# Patient Record
Sex: Female | Born: 1940
Health system: Southern US, Community
[De-identification: ages and names within clinical notes are randomized; demographics above are authoritative.]

## PROBLEM LIST (undated history)

## (undated) DIAGNOSIS — M4800 Spinal stenosis, site unspecified: Secondary | ICD-10-CM

## (undated) DIAGNOSIS — I10 Essential (primary) hypertension: Secondary | ICD-10-CM

## (undated) DIAGNOSIS — E119 Type 2 diabetes mellitus without complications: Secondary | ICD-10-CM

## (undated) DIAGNOSIS — M1990 Unspecified osteoarthritis, unspecified site: Secondary | ICD-10-CM

## (undated) DIAGNOSIS — R059 Cough, unspecified: Secondary | ICD-10-CM

## (undated) DIAGNOSIS — E039 Hypothyroidism, unspecified: Secondary | ICD-10-CM

## (undated) DIAGNOSIS — R05 Cough: Secondary | ICD-10-CM

## (undated) DIAGNOSIS — J45909 Unspecified asthma, uncomplicated: Secondary | ICD-10-CM

## (undated) DIAGNOSIS — C4490 Unspecified malignant neoplasm of skin, unspecified: Secondary | ICD-10-CM

## (undated) DIAGNOSIS — G459 Transient cerebral ischemic attack, unspecified: Secondary | ICD-10-CM

## (undated) DIAGNOSIS — I739 Peripheral vascular disease, unspecified: Secondary | ICD-10-CM

## (undated) DIAGNOSIS — C73 Malignant neoplasm of thyroid gland: Secondary | ICD-10-CM

## (undated) DIAGNOSIS — K219 Gastro-esophageal reflux disease without esophagitis: Secondary | ICD-10-CM

## (undated) DIAGNOSIS — I499 Cardiac arrhythmia, unspecified: Secondary | ICD-10-CM

## (undated) DIAGNOSIS — M8580 Other specified disorders of bone density and structure, unspecified site: Secondary | ICD-10-CM

## (undated) HISTORY — PX: HAND SURGERY: SHX662

## (undated) HISTORY — PX: WRIST SURGERY: SHX841

## (undated) HISTORY — PX: ORIF FIBULA FRACTURE: SHX2121

## (undated) HISTORY — PX: BREAST EXCISIONAL BIOPSY: SUR124

## (undated) HISTORY — PX: SKIN CANCER EXCISION: SHX779

## (undated) HISTORY — PX: BREAST CYST ASPIRATION: SHX578

## (undated) HISTORY — PX: JOINT REPLACEMENT: SHX530

## (undated) HISTORY — PX: CHOLECYSTECTOMY: SHX55

## (undated) HISTORY — PX: MIDDLE EAR SURGERY: SHX713

## (undated) HISTORY — PX: TONSILLECTOMY: SUR1361

---

## 2005-05-08 ENCOUNTER — Ambulatory Visit: Payer: Self-pay | Admitting: Obstetrics and Gynecology

## 2006-02-04 ENCOUNTER — Other Ambulatory Visit: Payer: Self-pay

## 2006-02-04 ENCOUNTER — Ambulatory Visit: Payer: Self-pay | Admitting: Obstetrics and Gynecology

## 2006-02-06 ENCOUNTER — Ambulatory Visit: Payer: Self-pay | Admitting: Gastroenterology

## 2006-02-09 ENCOUNTER — Ambulatory Visit: Payer: Self-pay | Admitting: Obstetrics and Gynecology

## 2006-02-17 ENCOUNTER — Ambulatory Visit: Payer: Self-pay | Admitting: Gastroenterology

## 2006-05-11 ENCOUNTER — Ambulatory Visit: Payer: Self-pay | Admitting: Obstetrics and Gynecology

## 2007-05-18 ENCOUNTER — Ambulatory Visit: Payer: Self-pay | Admitting: Obstetrics and Gynecology

## 2008-01-16 ENCOUNTER — Emergency Department: Payer: Self-pay | Admitting: Emergency Medicine

## 2008-06-06 ENCOUNTER — Ambulatory Visit: Payer: Self-pay | Admitting: Obstetrics and Gynecology

## 2008-09-15 ENCOUNTER — Ambulatory Visit: Payer: Self-pay | Admitting: Internal Medicine

## 2009-06-11 ENCOUNTER — Ambulatory Visit: Payer: Self-pay | Admitting: Obstetrics and Gynecology

## 2010-06-13 ENCOUNTER — Ambulatory Visit: Payer: Self-pay | Admitting: Obstetrics and Gynecology

## 2010-06-18 ENCOUNTER — Ambulatory Visit: Payer: Self-pay | Admitting: Obstetrics and Gynecology

## 2010-09-29 DIAGNOSIS — G459 Transient cerebral ischemic attack, unspecified: Secondary | ICD-10-CM

## 2010-09-29 HISTORY — DX: Transient cerebral ischemic attack, unspecified: G45.9

## 2010-12-24 ENCOUNTER — Ambulatory Visit: Payer: Self-pay | Admitting: Surgery

## 2011-01-20 ENCOUNTER — Observation Stay: Payer: Self-pay | Admitting: Internal Medicine

## 2011-01-22 ENCOUNTER — Encounter: Payer: Self-pay | Admitting: Internal Medicine

## 2011-01-28 ENCOUNTER — Encounter: Payer: Self-pay | Admitting: Internal Medicine

## 2011-06-16 ENCOUNTER — Ambulatory Visit: Payer: Self-pay | Admitting: Obstetrics and Gynecology

## 2012-05-19 ENCOUNTER — Ambulatory Visit: Payer: Self-pay | Admitting: Internal Medicine

## 2012-06-16 ENCOUNTER — Ambulatory Visit: Payer: Self-pay | Admitting: Obstetrics and Gynecology

## 2013-06-17 ENCOUNTER — Ambulatory Visit: Payer: Self-pay | Admitting: Obstetrics and Gynecology

## 2013-06-21 ENCOUNTER — Ambulatory Visit: Payer: Self-pay | Admitting: Internal Medicine

## 2013-06-23 ENCOUNTER — Ambulatory Visit: Payer: Self-pay | Admitting: Hematology and Oncology

## 2013-06-24 ENCOUNTER — Ambulatory Visit: Payer: Self-pay | Admitting: Hematology and Oncology

## 2013-06-29 ENCOUNTER — Ambulatory Visit: Payer: Self-pay | Admitting: Hematology and Oncology

## 2013-07-30 ENCOUNTER — Ambulatory Visit: Payer: Self-pay | Admitting: Hematology and Oncology

## 2013-10-17 ENCOUNTER — Ambulatory Visit: Payer: Self-pay | Admitting: Hematology and Oncology

## 2013-10-18 ENCOUNTER — Ambulatory Visit: Payer: Self-pay | Admitting: Hematology and Oncology

## 2013-10-30 ENCOUNTER — Ambulatory Visit: Payer: Self-pay | Admitting: Hematology and Oncology

## 2013-12-14 ENCOUNTER — Ambulatory Visit: Payer: Self-pay | Admitting: Orthopedic Surgery

## 2013-12-14 LAB — CBC WITH DIFFERENTIAL/PLATELET
BASOS ABS: 0.1 10*3/uL (ref 0.0–0.1)
Basophil %: 0.7 %
EOS ABS: 0.3 10*3/uL (ref 0.0–0.7)
Eosinophil %: 2.7 %
HCT: 35.1 % (ref 35.0–47.0)
HGB: 11.9 g/dL — ABNORMAL LOW (ref 12.0–16.0)
LYMPHS PCT: 18.1 %
Lymphocyte #: 1.7 10*3/uL (ref 1.0–3.6)
MCH: 31.3 pg (ref 26.0–34.0)
MCHC: 34 g/dL (ref 32.0–36.0)
MCV: 92 fL (ref 80–100)
MONO ABS: 0.8 x10 3/mm (ref 0.2–0.9)
Monocyte %: 8.2 %
NEUTROS ABS: 6.8 10*3/uL — AB (ref 1.4–6.5)
Neutrophil %: 70.3 %
Platelet: 354 10*3/uL (ref 150–440)
RBC: 3.8 10*6/uL (ref 3.80–5.20)
RDW: 13.1 % (ref 11.5–14.5)
WBC: 9.6 10*3/uL (ref 3.6–11.0)

## 2013-12-14 LAB — BASIC METABOLIC PANEL
ANION GAP: 5 — AB (ref 7–16)
BUN: 21 mg/dL — AB (ref 7–18)
CHLORIDE: 100 mmol/L (ref 98–107)
CO2: 33 mmol/L — AB (ref 21–32)
CREATININE: 1.03 mg/dL (ref 0.60–1.30)
Calcium, Total: 8.9 mg/dL (ref 8.5–10.1)
EGFR (African American): >60
EGFR (Non-African Amer.): 54 — ABNORMAL LOW
GLUCOSE: 162 mg/dL — AB (ref 65–99)
OSMOLALITY: 282 (ref 275–301)
Potassium: 3.4 mmol/L — ABNORMAL LOW (ref 3.5–5.1)
SODIUM: 138 mmol/L (ref 136–145)

## 2013-12-16 ENCOUNTER — Ambulatory Visit: Payer: Self-pay | Admitting: Orthopedic Surgery

## 2014-02-03 DIAGNOSIS — M189 Osteoarthritis of first carpometacarpal joint, unspecified: Secondary | ICD-10-CM | POA: Insufficient documentation

## 2014-02-03 DIAGNOSIS — Z8781 Personal history of (healed) traumatic fracture: Secondary | ICD-10-CM | POA: Insufficient documentation

## 2014-02-17 ENCOUNTER — Ambulatory Visit: Payer: Self-pay | Admitting: Hematology and Oncology

## 2014-02-24 ENCOUNTER — Ambulatory Visit: Payer: Self-pay | Admitting: Hematology and Oncology

## 2014-02-24 LAB — CBC CANCER CENTER
BASOS ABS: 0.1 x10 3/mm (ref 0.0–0.1)
Basophil %: 0.5 %
EOS ABS: 0.2 x10 3/mm (ref 0.0–0.7)
EOS PCT: 1.6 %
HCT: 38.2 % (ref 35.0–47.0)
HGB: 13 g/dL (ref 12.0–16.0)
LYMPHS PCT: 21.2 %
Lymphocyte #: 2.3 x10 3/mm (ref 1.0–3.6)
MCH: 31.2 pg (ref 26.0–34.0)
MCHC: 33.9 g/dL (ref 32.0–36.0)
MCV: 92 fL (ref 80–100)
MONO ABS: 0.9 x10 3/mm (ref 0.2–0.9)
Monocyte %: 8.2 %
NEUTROS PCT: 68.5 %
Neutrophil #: 7.3 x10 3/mm — ABNORMAL HIGH (ref 1.4–6.5)
PLATELETS: 381 x10 3/mm (ref 150–440)
RBC: 4.16 10*6/uL (ref 3.80–5.20)
RDW: 13.1 % (ref 11.5–14.5)
WBC: 10.7 x10 3/mm (ref 3.6–11.0)

## 2014-02-24 LAB — BASIC METABOLIC PANEL
ANION GAP: 9 (ref 7–16)
BUN: 12 mg/dL (ref 7–18)
CALCIUM: 9.5 mg/dL (ref 8.5–10.1)
Chloride: 99 mmol/L (ref 98–107)
Co2: 30 mmol/L (ref 21–32)
Creatinine: 0.75 mg/dL (ref 0.60–1.30)
EGFR (Non-African Amer.): >60
GLUCOSE: 97 mg/dL (ref 65–99)
OSMOLALITY: 275 (ref 275–301)
POTASSIUM: 4 mmol/L (ref 3.5–5.1)
SODIUM: 138 mmol/L (ref 136–145)

## 2014-02-27 ENCOUNTER — Ambulatory Visit: Payer: Self-pay | Admitting: Hematology and Oncology

## 2014-06-19 ENCOUNTER — Ambulatory Visit: Payer: Self-pay | Admitting: Internal Medicine

## 2014-08-18 ENCOUNTER — Ambulatory Visit: Payer: Self-pay | Admitting: Hematology and Oncology

## 2014-08-22 ENCOUNTER — Ambulatory Visit: Payer: Self-pay | Admitting: Hematology and Oncology

## 2014-08-29 ENCOUNTER — Ambulatory Visit: Payer: Self-pay | Admitting: Hematology and Oncology

## 2014-09-29 DIAGNOSIS — C73 Malignant neoplasm of thyroid gland: Secondary | ICD-10-CM

## 2014-09-29 HISTORY — DX: Malignant neoplasm of thyroid gland: C73

## 2015-01-12 ENCOUNTER — Other Ambulatory Visit: Payer: Self-pay | Admitting: Hematology and Oncology

## 2015-01-12 DIAGNOSIS — R911 Solitary pulmonary nodule: Secondary | ICD-10-CM

## 2015-01-17 ENCOUNTER — Ambulatory Visit
Admit: 2015-01-17 | Disposition: A | Discharge status: Home / Self Care | Payer: Self-pay | Attending: Ophthalmology | Admitting: Ophthalmology

## 2015-01-17 HISTORY — PX: CATARACT EXTRACTION: SUR2

## 2015-01-20 NOTE — Op Note (Signed)
PATIENT NAME:  Alice Weaver, Alice Weaver MR#:  [id number] DATE OF BIRTH:  June 23, 1941  DATE OF PROCEDURE:  12/16/2013  PREOPERATIVE DIAGNOSIS: Right distal radius fracture, displaced.   POSTOPERATIVE DIAGNOSIS:  Right distal radius fracture, displaced.  PROCEDURE PERFORMED: ORIF, right distal radius.   ANESTHESIA: General.   SURGEON: Hessie Knows, M.D.   DESCRIPTION OF PROCEDURE: The patient was brought to the operating room and after adequate anesthesia was obtained, the right arm was prepped and draped in the usual sterile fashion with a tourniquet applied to the upper arm. After patient identification and timeout procedures were completed, the tourniquet was raised to 250 mmHg and fingertrap traction was applied with 10 pounds of traction. Length was restored along with radial inclination and some volar tilt. After initial assessment under the mini C-arm, a volar approach was made centered over the FCR tendon. Tendon sheath incised and the tendon retracted radially, protecting the radial artery. The deep fascia was incised and the pronator was elevated off the distal radius. The fracture was 2 main fragments and did not appear to have intra-articular extension. After exposing the volar surface of the distal radius, a short narrow DVR plate was applied and placed in the appropriate position with a pin holding it in position initially. Slotted screw hole was filled first, and then minor adjustments made to plate position. The 2 remaining cortical screw holes were then filled. With the wrist held in a flexed position, distal smooth pegs were placed, and the proximal row multiaccess pins were placed to prevent penetration into the joint. After all peg holes were filled, the traction was released and on placing the wrist through range of motion, there was appropriate and stable fixation apparent with no evidence of motion at the site. The tourniquet was let down and the wound irrigated. There was no significant  bleeding. The wound was closed with 3-0 Vicryl subcutaneously and 4-0 nylon for the skin. Xeroform, 4 x 4's, Webril and volar splint were applied followed by an Ace wrap. Tourniquet time was 28 minutes at 250 mmHg.  There were no complications and no specimen.  IMPLANTS:  [employer] from [employer], short narrow right DVR plate with multiple pegs and screws.    ____________________________ Laurene Footman, MD mjm:dmm D: 12/16/2013 12:24:45 ET T: 12/16/2013 14:00:44 ET JOB#: [id number]  cc: Laurene Footman, MD, <Dictator> Laurene Footman MD ELECTRONICALLY SIGNED 12/17/2013 11:19

## 2015-01-25 DIAGNOSIS — E041 Nontoxic single thyroid nodule: Secondary | ICD-10-CM | POA: Insufficient documentation

## 2015-02-23 ENCOUNTER — Encounter: Payer: Self-pay | Admitting: *Deleted

## 2015-02-27 NOTE — Discharge Instructions (Signed)

## 2015-02-28 ENCOUNTER — Ambulatory Visit: Payer: PPO | Admitting: Anesthesiology

## 2015-02-28 ENCOUNTER — Encounter: Payer: Self-pay | Admitting: Anesthesiology

## 2015-02-28 ENCOUNTER — Ambulatory Visit
Admission: RE | Admit: 2015-02-28 | Discharge: 2015-02-28 | Disposition: A | Discharge status: Home / Self Care | Payer: PPO | Source: Ambulatory Visit | Attending: Ophthalmology | Admitting: Ophthalmology

## 2015-02-28 ENCOUNTER — Encounter
Admission: RE | Disposition: A | Discharge status: Home / Self Care | Payer: Self-pay | Source: Ambulatory Visit | Attending: Ophthalmology

## 2015-02-28 DIAGNOSIS — M1990 Unspecified osteoarthritis, unspecified site: Secondary | ICD-10-CM | POA: Diagnosis not present

## 2015-02-28 DIAGNOSIS — I10 Essential (primary) hypertension: Secondary | ICD-10-CM | POA: Insufficient documentation

## 2015-02-28 DIAGNOSIS — Z87891 Personal history of nicotine dependence: Secondary | ICD-10-CM | POA: Insufficient documentation

## 2015-02-28 DIAGNOSIS — Z8673 Personal history of transient ischemic attack (TIA), and cerebral infarction without residual deficits: Secondary | ICD-10-CM | POA: Insufficient documentation

## 2015-02-28 DIAGNOSIS — Z96659 Presence of unspecified artificial knee joint: Secondary | ICD-10-CM | POA: Diagnosis not present

## 2015-02-28 DIAGNOSIS — K219 Gastro-esophageal reflux disease without esophagitis: Secondary | ICD-10-CM | POA: Diagnosis not present

## 2015-02-28 DIAGNOSIS — E119 Type 2 diabetes mellitus without complications: Secondary | ICD-10-CM | POA: Diagnosis not present

## 2015-02-28 DIAGNOSIS — E780 Pure hypercholesterolemia: Secondary | ICD-10-CM | POA: Diagnosis not present

## 2015-02-28 DIAGNOSIS — H2511 Age-related nuclear cataract, right eye: Secondary | ICD-10-CM | POA: Insufficient documentation

## 2015-02-28 DIAGNOSIS — J45909 Unspecified asthma, uncomplicated: Secondary | ICD-10-CM | POA: Diagnosis not present

## 2015-02-28 HISTORY — DX: Unspecified osteoarthritis, unspecified site: M19.90

## 2015-02-28 HISTORY — DX: Transient cerebral ischemic attack, unspecified: G45.9

## 2015-02-28 HISTORY — DX: Cardiac arrhythmia, unspecified: I49.9

## 2015-02-28 HISTORY — DX: Other specified disorders of bone density and structure, unspecified site: M85.80

## 2015-02-28 HISTORY — DX: Hypothyroidism, unspecified: E03.9

## 2015-02-28 HISTORY — DX: Type 2 diabetes mellitus without complications: E11.9

## 2015-02-28 HISTORY — DX: Cough, unspecified: R05.9

## 2015-02-28 HISTORY — DX: Essential (primary) hypertension: I10

## 2015-02-28 HISTORY — DX: Peripheral vascular disease, unspecified: I73.9

## 2015-02-28 HISTORY — DX: Unspecified malignant neoplasm of skin, unspecified: C44.90

## 2015-02-28 HISTORY — DX: Spinal stenosis, site unspecified: M48.00

## 2015-02-28 HISTORY — DX: Cough: R05

## 2015-02-28 HISTORY — DX: Gastro-esophageal reflux disease without esophagitis: K21.9

## 2015-02-28 HISTORY — DX: Unspecified asthma, uncomplicated: J45.909

## 2015-02-28 HISTORY — PX: CATARACT EXTRACTION W/PHACO: SHX586

## 2015-02-28 LAB — GLUCOSE, CAPILLARY: GLUCOSE-CAPILLARY: 144 mg/dL — AB (ref 65–99)

## 2015-02-28 SURGERY — PHACOEMULSIFICATION, CATARACT, WITH IOL INSERTION
Anesthesia: Monitor Anesthesia Care | Laterality: Right

## 2015-02-28 MED ORDER — TETRACAINE HCL 0.5 % OP SOLN
1.0000 [drp] | OPHTHALMIC | Status: DC | PRN
  Administered 2015-02-28: 1 [drp] via OPHTHALMIC

## 2015-02-28 MED ORDER — EPINEPHRINE HCL 1 MG/ML IJ SOLN
INTRAMUSCULAR | Status: DC | PRN
  Administered 2015-02-28: 1 mg

## 2015-02-28 MED ORDER — MIDAZOLAM HCL 2 MG/2ML IJ SOLN
INTRAMUSCULAR | Status: DC | PRN
  Administered 2015-02-28 (×2): 1 mg via INTRAVENOUS

## 2015-02-28 MED ORDER — POVIDONE-IODINE 5 % OP SOLN
1.0000 "application " | OPHTHALMIC | Status: DC | PRN
  Administered 2015-02-28: 1 via OPHTHALMIC

## 2015-02-28 MED ORDER — NA HYALUR & NA CHOND-NA HYALUR 0.4-0.35 ML IO KIT
PACK | INTRAOCULAR | Status: DC | PRN
  Administered 2015-02-28: 1 mL via INTRAOCULAR

## 2015-02-28 MED ORDER — ARMC OPHTHALMIC DILATING GEL
1.0000 "application " | OPHTHALMIC | Status: DC | PRN
  Administered 2015-02-28 (×2): 1 via OPHTHALMIC

## 2015-02-28 MED ORDER — CEFUROXIME OPHTHALMIC INJECTION 1 MG/0.1 ML
INJECTION | OPHTHALMIC | Status: DC | PRN
  Administered 2015-02-28: 0.1 mL via INTRACAMERAL

## 2015-02-28 MED ORDER — ERYTHROMYCIN 5 MG/GM OP OINT
TOPICAL_OINTMENT | OPHTHALMIC | Status: DC | PRN
  Administered 2015-02-28: 1 via OPHTHALMIC

## 2015-02-28 MED ORDER — BRIMONIDINE TARTRATE 0.2 % OP SOLN
OPHTHALMIC | Status: DC | PRN
  Administered 2015-02-28: 1 [drp] via OPHTHALMIC

## 2015-02-28 MED ORDER — TIMOLOL MALEATE 0.5 % OP SOLN
OPHTHALMIC | Status: DC | PRN
  Administered 2015-02-28: 1 [drp] via OPHTHALMIC

## 2015-02-28 MED ORDER — ALFENTANIL 500 MCG/ML IJ INJ
INJECTION | INTRAMUSCULAR | Status: DC | PRN
  Administered 2015-02-28: 300 ug via INTRAVENOUS
  Administered 2015-02-28: 400 ug via INTRAVENOUS
  Administered 2015-02-28: 300 ug via INTRAVENOUS

## 2015-02-28 MED ORDER — LIDOCAINE HCL (PF) 4 % IJ SOLN
INTRAMUSCULAR | Status: DC | PRN
  Administered 2015-02-28: 2.5 mL

## 2015-02-28 SURGICAL SUPPLY — 27 items
CANNULA ANT/CHMB 27GA (MISCELLANEOUS) ×3 IMPLANT
GLOVE SURG LX 7.5 STRW (GLOVE) ×2
GLOVE SURG LX STRL 7.5 STRW (GLOVE) ×1 IMPLANT
GLOVE SURG TRIUMPH 8.0 PF LTX (GLOVE) ×3 IMPLANT
GOWN STRL REUS W/ TWL LRG LVL3 (GOWN DISPOSABLE) ×2 IMPLANT
GOWN STRL REUS W/TWL LRG LVL3 (GOWN DISPOSABLE) ×4
LENS IOL TECNIS TRC I 225 20.5 (Intraocular Lens) ×1 IMPLANT
LENS IOL TORIC 20.5 (Intraocular Lens) ×2 IMPLANT
LENS IOL TORIC 225 20.5 (Intraocular Lens) ×1 IMPLANT
MARKER SKIN SURG W/RULER VIO (MISCELLANEOUS) ×3 IMPLANT
NDL RETROBULBAR .5 NSTRL (NEEDLE) IMPLANT
NEEDLE FILTER BLUNT 18X 1/2SAF (NEEDLE) ×2
NEEDLE FILTER BLUNT 18X1 1/2 (NEEDLE) ×1 IMPLANT
PACK CATARACT BRASINGTON (MISCELLANEOUS) ×3 IMPLANT
PACK EYE AFTER SURG (MISCELLANEOUS) ×3 IMPLANT
PACK OPTHALMIC (MISCELLANEOUS) ×3 IMPLANT
RING MALYGIN 7.0 (MISCELLANEOUS) IMPLANT
SUT ETHILON 10-0 CS-B-6CS-B-6 (SUTURE)
SUT VICRYL  9 0 (SUTURE)
SUT VICRYL 9 0 (SUTURE) IMPLANT
SUTURE EHLN 10-0 CS-B-6CS-B-6 (SUTURE) IMPLANT
SYR 3ML LL SCALE MARK (SYRINGE) ×3 IMPLANT
SYR 5ML LL (SYRINGE) IMPLANT
SYR TB 1ML LUER SLIP (SYRINGE) ×3 IMPLANT
WATER STERILE IRR 250ML POUR (IV SOLUTION) ×3 IMPLANT
WATER STERILE IRR 500ML POUR (IV SOLUTION) IMPLANT
WIPE NON LINTING 3.25X3.25 (MISCELLANEOUS) ×3 IMPLANT

## 2015-02-28 NOTE — Anesthesia Postprocedure Evaluation (Signed)
  Anesthesia Post-op Note  Patient: [name]  Procedure(s) Performed: Procedure(s) with comments: CATARACT EXTRACTION PHACO AND INTRAOCULAR LENS PLACEMENT ([facility]) (Right) - IVA BLOCK TORIC LENS DIABETIC  Anesthesia type:MAC  Patient location: PACU  Post pain: Pain level controlled  Post assessment: Post-op Vital signs reviewed, Patient's Cardiovascular Status Stable, Respiratory Function Stable, Patent Airway and No signs of Nausea or vomiting  Post vital signs: Reviewed and stable  Last Vitals:  Filed Vitals:   02/28/15 0845  BP: 153/77  Pulse: 83  Temp: 36.6 C  Resp: 18    Level of consciousness: awake, alert  and patient cooperative  Complications: No apparent anesthesia complications

## 2015-02-28 NOTE — Transfer of Care (Signed)
Immediate Anesthesia Transfer of Care Note  Patient: Alice Weaver  Procedure(s) Performed: Procedure(s) with comments: CATARACT EXTRACTION PHACO AND INTRAOCULAR LENS PLACEMENT ([place]) (Right) - IVA BLOCK TORIC LENS DIABETIC  Patient Location: PACU  Anesthesia Type: MAC  Level of Consciousness: awake, alert  and patient cooperative  Airway and Oxygen Therapy: Patient Spontanous Breathing and Patient connected to supplemental oxygen  Post-op Assessment: Post-op Vital signs reviewed, Patient's Cardiovascular Status Stable, Respiratory Function Stable, Patent Airway and No signs of Nausea or vomiting  Post-op Vital Signs: Reviewed and stable  Complications: No apparent anesthesia complications

## 2015-02-28 NOTE — H&P (Signed)
  The History and Physical notes were scanned in.  The patient remains stable and unchanged from the H&P.   Previous H&P reviewed, patient examined, and there are no changes.  Alice Weaver 02/28/2015 7:56 AM

## 2015-02-28 NOTE — Anesthesia Preprocedure Evaluation (Signed)
Anesthesia Evaluation  Patient identified by MRN, date of birth, ID band  Reviewed: Allergy & Precautions, H&P , NPO status , Patient's Chart, lab work & pertinent test results  Airway Mallampati: II  TM Distance: >3 FB Neck ROM: full    Dental no notable dental hx.    Pulmonary asthma , former smoker,    Pulmonary exam normal       Cardiovascular hypertension, + dysrhythmias Rhythm:regular Rate:Normal     Neuro/Psych    GI/Hepatic   Endo/Other  diabetesHypothyroidism   Renal/GU      Musculoskeletal   Abdominal   Peds  Hematology   Anesthesia Other Findings   Reproductive/Obstetrics                             Anesthesia Physical Anesthesia Plan  ASA: II  Anesthesia Plan: MAC   Post-op Pain Management:    Induction:   Airway Management Planned:   Additional Equipment:   Intra-op Plan:   Post-operative Plan:   Informed Consent: I have reviewed the patients History and Physical, chart, labs and discussed the procedure including the risks, benefits and alternatives for the proposed anesthesia with the patient or authorized representative who has indicated his/her understanding and acceptance.     Plan Discussed with: CRNA  Anesthesia Plan Comments:         Anesthesia Quick Evaluation

## 2015-02-28 NOTE — Op Note (Signed)
LOCATION:  [place]   PREOPERATIVE DIAGNOSIS:  Nuclear sclerotic cataract of the right eye.  H25.11   POSTOPERATIVE DIAGNOSIS:  Nuclear sclerotic cataract of the right eye.   PROCEDURE:  Phacoemulsification with Toric posterior chamber intraocular lens placement of the right eye.   LENS:   Implant Name Type Inv. Item Serial No. Manufacturer Lot No. LRB No. Used  intraocular lens Intraocular Lens   [id number]     Right 1     [id number] 20.5 diopter Toric intraocular lens with 2.25 diopters of cylindrical power with axis orientation at 23 degrees.   ULTRASOUND TIME: 18 % of 1 minutes, 5 seconds.  CDE 11.8   SURGEON:  Wyonia Hough, MD   ANESTHESIA:  Retrobulbar block of Xylocaine and Bupivacaine   COMPLICATIONS:  None.   DESCRIPTION OF PROCEDURE:   The patient was identified in the holding room, transported to the operating room, and placed in the supine position.  The right eye was identified as the operative eye and a retrobulbar block was administered under intravenous sedation.  It was then prepped and draped in the usual sterile ophthalmic fashion.  A clear-corneal paracentesis incision was made at the 12:00 position.  The anterior chamber was filled with Viscoat.  A 2.4 millimeter near clear corneal incision was then made at the 9:00 position.  A cystotome and capsulorrhexis forceps were then used to make a curvilinear capsulorrhexis.  Hydrodissection and hydrodelineation were then performed using balanced salt solution.   Phacoemulsification was then used in stop and chop fashion to remove the lens, nucleus and epinucleus.  The remaining cortex was aspirated using the irrigation and aspiration handpiece.  Provisc viscoelastic was then placed into the capsular bag to distend it for lens placement.  The Verion digital marker was used to align the implant at the intended axis.   A Toric lens was then injected into the capsular bag.  It was rotated clockwise until the  axis marks on the lens were approximately 15 degrees in the counterclockwise direction to the intended alignment.  The viscoelastic was aspirated from the eye using the irrigation aspiration handpiece.  Then, a Koch spatula through the sideport incision was used to rotate the lens in a clockwise direction until the axis markings of the intraocular lens were lined up with the Verion alignment.  Balanced salt solution was then used to hydrate the wounds. Cefuroxime 0.1 ml of a 10mg /ml solution was injected into the anterior chamber for a dose of 1 mg of intracameral antibiotic at the completion of the case.     Wounds were hydrated with balanced salt solution.  The anterior chamber was inflated to a physiologic pressure with balanced salt solution.  No wound leaks were noted.Cefuroxime 0.1 ml of a 10mg /ml solution was injected into the anterior chamber for a dose of 1 mg of intracameral antibiotic at the completion of the case.  Timolol and Brimonidine drops were placed followed by erythromycin ointment.  The eye was patched.  The patient was taken to the recovery room in stable condition without complications of anesthesia or surgery.     Devlin Mcveigh 02/28/2015, 8:42 AM

## 2015-02-28 NOTE — Anesthesia Procedure Notes (Signed)
Procedure Name: MAC Date/Time: 02/28/2015 8:12 AM Performed by: Cameron Ali Pre-anesthesia Checklist: Patient identified, Emergency Drugs available, Suction available, Timeout performed and Patient being monitored Patient Re-evaluated:Patient Re-evaluated prior to inductionOxygen Delivery Method: Nasal cannula Placement Confirmation: positive ETCO2

## 2015-04-03 ENCOUNTER — Encounter: Payer: Self-pay | Admitting: Ophthalmology

## 2015-04-18 ENCOUNTER — Ambulatory Visit: Payer: PPO

## 2015-04-23 ENCOUNTER — Ambulatory Visit: Payer: Self-pay | Admitting: Hematology and Oncology

## 2015-05-21 DIAGNOSIS — C73 Malignant neoplasm of thyroid gland: Secondary | ICD-10-CM | POA: Insufficient documentation

## 2015-05-22 ENCOUNTER — Other Ambulatory Visit: Payer: Self-pay | Admitting: Internal Medicine

## 2015-05-22 DIAGNOSIS — Z1231 Encounter for screening mammogram for malignant neoplasm of breast: Secondary | ICD-10-CM

## 2015-06-21 ENCOUNTER — Ambulatory Visit
Admission: RE | Admit: 2015-06-21 | Discharge: 2015-06-21 | Disposition: A | Discharge status: Home / Self Care | Payer: PPO | Source: Ambulatory Visit | Attending: Internal Medicine | Admitting: Internal Medicine

## 2015-06-21 DIAGNOSIS — Z1231 Encounter for screening mammogram for malignant neoplasm of breast: Secondary | ICD-10-CM | POA: Insufficient documentation

## 2015-06-21 HISTORY — DX: Malignant neoplasm of thyroid gland: C73

## 2015-10-12 DIAGNOSIS — E119 Type 2 diabetes mellitus without complications: Secondary | ICD-10-CM | POA: Diagnosis not present

## 2015-10-15 DIAGNOSIS — C73 Malignant neoplasm of thyroid gland: Secondary | ICD-10-CM | POA: Diagnosis not present

## 2015-10-15 DIAGNOSIS — E119 Type 2 diabetes mellitus without complications: Secondary | ICD-10-CM | POA: Diagnosis not present

## 2015-10-15 DIAGNOSIS — I10 Essential (primary) hypertension: Secondary | ICD-10-CM | POA: Diagnosis not present

## 2015-10-15 DIAGNOSIS — Z0000 Encounter for general adult medical examination without abnormal findings: Secondary | ICD-10-CM | POA: Diagnosis not present

## 2015-10-15 DIAGNOSIS — M1990 Unspecified osteoarthritis, unspecified site: Secondary | ICD-10-CM | POA: Diagnosis not present

## 2015-10-29 DIAGNOSIS — C73 Malignant neoplasm of thyroid gland: Secondary | ICD-10-CM | POA: Diagnosis not present

## 2015-10-29 DIAGNOSIS — E890 Postprocedural hypothyroidism: Secondary | ICD-10-CM | POA: Diagnosis not present

## 2015-12-10 DIAGNOSIS — D485 Neoplasm of uncertain behavior of skin: Secondary | ICD-10-CM | POA: Diagnosis not present

## 2015-12-10 DIAGNOSIS — Z85820 Personal history of malignant melanoma of skin: Secondary | ICD-10-CM | POA: Diagnosis not present

## 2015-12-10 DIAGNOSIS — L820 Inflamed seborrheic keratosis: Secondary | ICD-10-CM | POA: Diagnosis not present

## 2015-12-10 DIAGNOSIS — D692 Other nonthrombocytopenic purpura: Secondary | ICD-10-CM | POA: Diagnosis not present

## 2015-12-10 DIAGNOSIS — L821 Other seborrheic keratosis: Secondary | ICD-10-CM | POA: Diagnosis not present

## 2015-12-10 DIAGNOSIS — L720 Epidermal cyst: Secondary | ICD-10-CM | POA: Diagnosis not present

## 2015-12-10 DIAGNOSIS — Z85828 Personal history of other malignant neoplasm of skin: Secondary | ICD-10-CM | POA: Diagnosis not present

## 2015-12-10 DIAGNOSIS — D229 Melanocytic nevi, unspecified: Secondary | ICD-10-CM | POA: Diagnosis not present

## 2015-12-10 DIAGNOSIS — L578 Other skin changes due to chronic exposure to nonionizing radiation: Secondary | ICD-10-CM | POA: Diagnosis not present

## 2015-12-10 DIAGNOSIS — D1800 Hemangioma unspecified site: Secondary | ICD-10-CM | POA: Diagnosis not present

## 2015-12-10 DIAGNOSIS — I8393 Asymptomatic varicose veins of bilateral lower extremities: Secondary | ICD-10-CM | POA: Diagnosis not present

## 2015-12-10 DIAGNOSIS — Z1283 Encounter for screening for malignant neoplasm of skin: Secondary | ICD-10-CM | POA: Diagnosis not present

## 2015-12-18 DIAGNOSIS — E890 Postprocedural hypothyroidism: Secondary | ICD-10-CM | POA: Diagnosis not present

## 2015-12-25 DIAGNOSIS — E890 Postprocedural hypothyroidism: Secondary | ICD-10-CM | POA: Diagnosis not present

## 2015-12-25 DIAGNOSIS — C73 Malignant neoplasm of thyroid gland: Secondary | ICD-10-CM | POA: Diagnosis not present

## 2016-02-04 DIAGNOSIS — Z0000 Encounter for general adult medical examination without abnormal findings: Secondary | ICD-10-CM | POA: Diagnosis not present

## 2016-02-04 DIAGNOSIS — I10 Essential (primary) hypertension: Secondary | ICD-10-CM | POA: Diagnosis not present

## 2016-02-04 DIAGNOSIS — E119 Type 2 diabetes mellitus without complications: Secondary | ICD-10-CM | POA: Diagnosis not present

## 2016-02-04 DIAGNOSIS — M1990 Unspecified osteoarthritis, unspecified site: Secondary | ICD-10-CM | POA: Diagnosis not present

## 2016-02-04 DIAGNOSIS — C73 Malignant neoplasm of thyroid gland: Secondary | ICD-10-CM | POA: Diagnosis not present

## 2016-02-12 DIAGNOSIS — M25561 Pain in right knee: Secondary | ICD-10-CM | POA: Diagnosis not present

## 2016-02-12 DIAGNOSIS — I10 Essential (primary) hypertension: Secondary | ICD-10-CM | POA: Diagnosis not present

## 2016-02-12 DIAGNOSIS — E119 Type 2 diabetes mellitus without complications: Secondary | ICD-10-CM | POA: Diagnosis not present

## 2016-02-12 DIAGNOSIS — R2681 Unsteadiness on feet: Secondary | ICD-10-CM | POA: Diagnosis not present

## 2016-02-12 DIAGNOSIS — C73 Malignant neoplasm of thyroid gland: Secondary | ICD-10-CM | POA: Diagnosis not present

## 2016-02-12 DIAGNOSIS — M179 Osteoarthritis of knee, unspecified: Secondary | ICD-10-CM | POA: Diagnosis not present

## 2016-02-12 DIAGNOSIS — Z85828 Personal history of other malignant neoplasm of skin: Secondary | ICD-10-CM | POA: Diagnosis not present

## 2016-02-12 DIAGNOSIS — Z23 Encounter for immunization: Secondary | ICD-10-CM | POA: Diagnosis not present

## 2016-02-12 DIAGNOSIS — Z780 Asymptomatic menopausal state: Secondary | ICD-10-CM | POA: Diagnosis not present

## 2016-02-26 DIAGNOSIS — Z780 Asymptomatic menopausal state: Secondary | ICD-10-CM | POA: Diagnosis not present

## 2016-02-29 DIAGNOSIS — M25561 Pain in right knee: Secondary | ICD-10-CM | POA: Diagnosis not present

## 2016-02-29 DIAGNOSIS — M1711 Unilateral primary osteoarthritis, right knee: Secondary | ICD-10-CM | POA: Diagnosis not present

## 2016-06-10 DIAGNOSIS — Z780 Asymptomatic menopausal state: Secondary | ICD-10-CM | POA: Diagnosis not present

## 2016-06-10 DIAGNOSIS — R2681 Unsteadiness on feet: Secondary | ICD-10-CM | POA: Diagnosis not present

## 2016-06-10 DIAGNOSIS — E119 Type 2 diabetes mellitus without complications: Secondary | ICD-10-CM | POA: Diagnosis not present

## 2016-06-10 DIAGNOSIS — Z85828 Personal history of other malignant neoplasm of skin: Secondary | ICD-10-CM | POA: Diagnosis not present

## 2016-06-10 DIAGNOSIS — I10 Essential (primary) hypertension: Secondary | ICD-10-CM | POA: Diagnosis not present

## 2016-06-10 DIAGNOSIS — C73 Malignant neoplasm of thyroid gland: Secondary | ICD-10-CM | POA: Diagnosis not present

## 2016-06-10 DIAGNOSIS — M25561 Pain in right knee: Secondary | ICD-10-CM | POA: Diagnosis not present

## 2016-06-17 ENCOUNTER — Other Ambulatory Visit: Payer: Self-pay | Admitting: Internal Medicine

## 2016-06-17 DIAGNOSIS — I10 Essential (primary) hypertension: Secondary | ICD-10-CM | POA: Diagnosis not present

## 2016-06-17 DIAGNOSIS — Z1231 Encounter for screening mammogram for malignant neoplasm of breast: Secondary | ICD-10-CM

## 2016-06-17 DIAGNOSIS — E119 Type 2 diabetes mellitus without complications: Secondary | ICD-10-CM | POA: Diagnosis not present

## 2016-06-17 DIAGNOSIS — M1990 Unspecified osteoarthritis, unspecified site: Secondary | ICD-10-CM | POA: Diagnosis not present

## 2016-06-17 DIAGNOSIS — C73 Malignant neoplasm of thyroid gland: Secondary | ICD-10-CM | POA: Diagnosis not present

## 2016-06-17 DIAGNOSIS — Z1239 Encounter for other screening for malignant neoplasm of breast: Secondary | ICD-10-CM | POA: Diagnosis not present

## 2016-06-20 DIAGNOSIS — E890 Postprocedural hypothyroidism: Secondary | ICD-10-CM | POA: Diagnosis not present

## 2016-06-20 DIAGNOSIS — C73 Malignant neoplasm of thyroid gland: Secondary | ICD-10-CM | POA: Diagnosis not present

## 2016-06-23 DIAGNOSIS — L578 Other skin changes due to chronic exposure to nonionizing radiation: Secondary | ICD-10-CM | POA: Diagnosis not present

## 2016-06-23 DIAGNOSIS — Z85820 Personal history of malignant melanoma of skin: Secondary | ICD-10-CM | POA: Diagnosis not present

## 2016-06-23 DIAGNOSIS — D692 Other nonthrombocytopenic purpura: Secondary | ICD-10-CM | POA: Diagnosis not present

## 2016-06-23 DIAGNOSIS — Z1283 Encounter for screening for malignant neoplasm of skin: Secondary | ICD-10-CM | POA: Diagnosis not present

## 2016-06-23 DIAGNOSIS — D485 Neoplasm of uncertain behavior of skin: Secondary | ICD-10-CM | POA: Diagnosis not present

## 2016-06-23 DIAGNOSIS — D229 Melanocytic nevi, unspecified: Secondary | ICD-10-CM | POA: Diagnosis not present

## 2016-06-23 DIAGNOSIS — L820 Inflamed seborrheic keratosis: Secondary | ICD-10-CM | POA: Diagnosis not present

## 2016-06-23 DIAGNOSIS — D1800 Hemangioma unspecified site: Secondary | ICD-10-CM | POA: Diagnosis not present

## 2016-06-23 DIAGNOSIS — I8393 Asymptomatic varicose veins of bilateral lower extremities: Secondary | ICD-10-CM | POA: Diagnosis not present

## 2016-06-23 DIAGNOSIS — L821 Other seborrheic keratosis: Secondary | ICD-10-CM | POA: Diagnosis not present

## 2016-06-23 DIAGNOSIS — Z85828 Personal history of other malignant neoplasm of skin: Secondary | ICD-10-CM | POA: Diagnosis not present

## 2016-06-27 DIAGNOSIS — Z85850 Personal history of malignant neoplasm of thyroid: Secondary | ICD-10-CM | POA: Diagnosis not present

## 2016-06-27 DIAGNOSIS — E890 Postprocedural hypothyroidism: Secondary | ICD-10-CM | POA: Diagnosis not present

## 2016-07-10 ENCOUNTER — Ambulatory Visit
Admission: RE | Admit: 2016-07-10 | Discharge: 2016-07-10 | Disposition: A | Discharge status: Home / Self Care | Payer: PPO | Source: Ambulatory Visit | Attending: Internal Medicine | Admitting: Internal Medicine

## 2016-07-10 DIAGNOSIS — Z1231 Encounter for screening mammogram for malignant neoplasm of breast: Secondary | ICD-10-CM

## 2016-07-11 ENCOUNTER — Other Ambulatory Visit: Payer: Self-pay | Admitting: Internal Medicine

## 2016-07-11 DIAGNOSIS — N644 Mastodynia: Secondary | ICD-10-CM

## 2016-07-11 DIAGNOSIS — N6459 Other signs and symptoms in breast: Secondary | ICD-10-CM

## 2016-07-15 ENCOUNTER — Ambulatory Visit
Admission: RE | Admit: 2016-07-15 | Discharge: 2016-07-15 | Disposition: A | Discharge status: Home / Self Care | Payer: PPO | Source: Ambulatory Visit | Attending: Internal Medicine | Admitting: Internal Medicine

## 2016-07-15 DIAGNOSIS — N644 Mastodynia: Secondary | ICD-10-CM

## 2016-07-15 DIAGNOSIS — N6459 Other signs and symptoms in breast: Secondary | ICD-10-CM

## 2016-07-15 DIAGNOSIS — R928 Other abnormal and inconclusive findings on diagnostic imaging of breast: Secondary | ICD-10-CM | POA: Diagnosis not present

## 2016-07-15 DIAGNOSIS — N6310 Unspecified lump in the right breast, unspecified quadrant: Secondary | ICD-10-CM | POA: Diagnosis not present

## 2016-07-15 DIAGNOSIS — N63 Unspecified lump in unspecified breast: Secondary | ICD-10-CM | POA: Insufficient documentation

## 2016-07-16 ENCOUNTER — Other Ambulatory Visit: Payer: Self-pay | Admitting: Internal Medicine

## 2016-07-16 DIAGNOSIS — N6310 Unspecified lump in the right breast, unspecified quadrant: Secondary | ICD-10-CM

## 2016-07-28 ENCOUNTER — Ambulatory Visit
Admission: RE | Admit: 2016-07-28 | Discharge: 2016-07-28 | Disposition: A | Discharge status: Home / Self Care | Payer: PPO | Source: Ambulatory Visit | Attending: Internal Medicine | Admitting: Internal Medicine

## 2016-07-28 DIAGNOSIS — N6310 Unspecified lump in the right breast, unspecified quadrant: Secondary | ICD-10-CM

## 2016-10-14 DIAGNOSIS — C73 Malignant neoplasm of thyroid gland: Secondary | ICD-10-CM | POA: Diagnosis not present

## 2016-10-14 DIAGNOSIS — E119 Type 2 diabetes mellitus without complications: Secondary | ICD-10-CM | POA: Diagnosis not present

## 2016-10-14 DIAGNOSIS — I10 Essential (primary) hypertension: Secondary | ICD-10-CM | POA: Diagnosis not present

## 2016-10-14 DIAGNOSIS — M1990 Unspecified osteoarthritis, unspecified site: Secondary | ICD-10-CM | POA: Diagnosis not present

## 2016-10-14 DIAGNOSIS — Z1231 Encounter for screening mammogram for malignant neoplasm of breast: Secondary | ICD-10-CM | POA: Diagnosis not present

## 2016-10-22 DIAGNOSIS — C73 Malignant neoplasm of thyroid gland: Secondary | ICD-10-CM | POA: Diagnosis not present

## 2016-10-22 DIAGNOSIS — R911 Solitary pulmonary nodule: Secondary | ICD-10-CM | POA: Diagnosis not present

## 2016-10-22 DIAGNOSIS — E119 Type 2 diabetes mellitus without complications: Secondary | ICD-10-CM | POA: Diagnosis not present

## 2016-10-22 DIAGNOSIS — N390 Urinary tract infection, site not specified: Secondary | ICD-10-CM | POA: Diagnosis not present

## 2016-10-22 DIAGNOSIS — I10 Essential (primary) hypertension: Secondary | ICD-10-CM | POA: Diagnosis not present

## 2016-10-22 DIAGNOSIS — M1711 Unilateral primary osteoarthritis, right knee: Secondary | ICD-10-CM | POA: Diagnosis not present

## 2016-10-22 DIAGNOSIS — Z0000 Encounter for general adult medical examination without abnormal findings: Secondary | ICD-10-CM | POA: Diagnosis not present

## 2016-10-23 ENCOUNTER — Other Ambulatory Visit: Payer: Self-pay | Admitting: Internal Medicine

## 2016-10-23 DIAGNOSIS — R911 Solitary pulmonary nodule: Secondary | ICD-10-CM

## 2016-11-03 ENCOUNTER — Ambulatory Visit: Admission: RE | Admit: 2016-11-03 | Payer: PPO | Source: Ambulatory Visit

## 2016-11-12 ENCOUNTER — Ambulatory Visit
Admission: RE | Admit: 2016-11-12 | Discharge: 2016-11-12 | Disposition: A | Discharge status: Home / Self Care | Payer: PPO | Source: Ambulatory Visit | Attending: Internal Medicine | Admitting: Internal Medicine

## 2016-11-12 DIAGNOSIS — R911 Solitary pulmonary nodule: Secondary | ICD-10-CM | POA: Insufficient documentation

## 2016-11-12 DIAGNOSIS — N281 Cyst of kidney, acquired: Secondary | ICD-10-CM | POA: Diagnosis not present

## 2016-11-12 DIAGNOSIS — I700 Atherosclerosis of aorta: Secondary | ICD-10-CM | POA: Insufficient documentation

## 2016-11-12 DIAGNOSIS — R918 Other nonspecific abnormal finding of lung field: Secondary | ICD-10-CM | POA: Diagnosis not present

## 2016-11-12 LAB — POCT I-STAT CREATININE: Creatinine, Ser: 0.7 mg/dL (ref 0.44–1.00)

## 2016-11-12 MED ORDER — IOPAMIDOL (ISOVUE-300) INJECTION 61%
75.0000 mL | Freq: Once | INTRAVENOUS | Status: AC | PRN
  Administered 2016-11-12: 75 mL via INTRAVENOUS

## 2016-11-18 DIAGNOSIS — E119 Type 2 diabetes mellitus without complications: Secondary | ICD-10-CM | POA: Diagnosis not present

## 2016-11-21 ENCOUNTER — Other Ambulatory Visit: Payer: Self-pay | Admitting: Internal Medicine

## 2016-11-21 DIAGNOSIS — R918 Other nonspecific abnormal finding of lung field: Secondary | ICD-10-CM

## 2016-12-04 ENCOUNTER — Encounter
Admission: RE | Admit: 2016-12-04 | Discharge: 2016-12-04 | Disposition: A | Discharge status: Home / Self Care | Payer: PPO | Source: Ambulatory Visit | Attending: Internal Medicine | Admitting: Internal Medicine

## 2016-12-04 DIAGNOSIS — R918 Other nonspecific abnormal finding of lung field: Secondary | ICD-10-CM | POA: Insufficient documentation

## 2016-12-04 DIAGNOSIS — R911 Solitary pulmonary nodule: Secondary | ICD-10-CM | POA: Diagnosis not present

## 2016-12-04 LAB — GLUCOSE, CAPILLARY: Glucose-Capillary: 182 mg/dL — ABNORMAL HIGH (ref 65–99)

## 2016-12-04 MED ORDER — FLUDEOXYGLUCOSE F - 18 (FDG) INJECTION
13.0400 | Freq: Once | INTRAVENOUS | Status: AC | PRN
  Administered 2016-12-04: 13.04 via INTRAVENOUS

## 2016-12-16 DIAGNOSIS — E890 Postprocedural hypothyroidism: Secondary | ICD-10-CM | POA: Diagnosis not present

## 2016-12-24 DIAGNOSIS — Z85850 Personal history of malignant neoplasm of thyroid: Secondary | ICD-10-CM | POA: Diagnosis not present

## 2016-12-24 DIAGNOSIS — E890 Postprocedural hypothyroidism: Secondary | ICD-10-CM | POA: Diagnosis not present

## 2016-12-24 DIAGNOSIS — I10 Essential (primary) hypertension: Secondary | ICD-10-CM | POA: Diagnosis not present

## 2017-01-06 DIAGNOSIS — D229 Melanocytic nevi, unspecified: Secondary | ICD-10-CM | POA: Diagnosis not present

## 2017-01-06 DIAGNOSIS — I8393 Asymptomatic varicose veins of bilateral lower extremities: Secondary | ICD-10-CM | POA: Diagnosis not present

## 2017-01-06 DIAGNOSIS — Z85820 Personal history of malignant melanoma of skin: Secondary | ICD-10-CM | POA: Diagnosis not present

## 2017-01-06 DIAGNOSIS — D1800 Hemangioma unspecified site: Secondary | ICD-10-CM | POA: Diagnosis not present

## 2017-01-06 DIAGNOSIS — Z85828 Personal history of other malignant neoplasm of skin: Secondary | ICD-10-CM | POA: Diagnosis not present

## 2017-01-06 DIAGNOSIS — L820 Inflamed seborrheic keratosis: Secondary | ICD-10-CM | POA: Diagnosis not present

## 2017-01-06 DIAGNOSIS — L720 Epidermal cyst: Secondary | ICD-10-CM | POA: Diagnosis not present

## 2017-01-06 DIAGNOSIS — Z1283 Encounter for screening for malignant neoplasm of skin: Secondary | ICD-10-CM | POA: Diagnosis not present

## 2017-01-06 DIAGNOSIS — L821 Other seborrheic keratosis: Secondary | ICD-10-CM | POA: Diagnosis not present

## 2017-01-06 DIAGNOSIS — D692 Other nonthrombocytopenic purpura: Secondary | ICD-10-CM | POA: Diagnosis not present

## 2017-01-06 DIAGNOSIS — D485 Neoplasm of uncertain behavior of skin: Secondary | ICD-10-CM | POA: Diagnosis not present

## 2017-01-28 ENCOUNTER — Other Ambulatory Visit: Payer: Self-pay | Admitting: Internal Medicine

## 2017-01-28 DIAGNOSIS — N6310 Unspecified lump in the right breast, unspecified quadrant: Secondary | ICD-10-CM

## 2017-02-05 ENCOUNTER — Other Ambulatory Visit: Payer: PPO

## 2017-02-05 ENCOUNTER — Ambulatory Visit: Payer: PPO

## 2017-02-13 DIAGNOSIS — E119 Type 2 diabetes mellitus without complications: Secondary | ICD-10-CM | POA: Diagnosis not present

## 2017-02-13 DIAGNOSIS — N390 Urinary tract infection, site not specified: Secondary | ICD-10-CM | POA: Diagnosis not present

## 2017-02-13 DIAGNOSIS — M1711 Unilateral primary osteoarthritis, right knee: Secondary | ICD-10-CM | POA: Diagnosis not present

## 2017-02-13 DIAGNOSIS — Z0000 Encounter for general adult medical examination without abnormal findings: Secondary | ICD-10-CM | POA: Diagnosis not present

## 2017-02-13 DIAGNOSIS — C73 Malignant neoplasm of thyroid gland: Secondary | ICD-10-CM | POA: Diagnosis not present

## 2017-02-13 DIAGNOSIS — I10 Essential (primary) hypertension: Secondary | ICD-10-CM | POA: Diagnosis not present

## 2017-02-13 DIAGNOSIS — R911 Solitary pulmonary nodule: Secondary | ICD-10-CM | POA: Diagnosis not present

## 2017-02-18 ENCOUNTER — Ambulatory Visit
Admission: RE | Admit: 2017-02-18 | Discharge: 2017-02-18 | Disposition: A | Discharge status: Home / Self Care | Payer: PPO | Source: Ambulatory Visit | Attending: Internal Medicine | Admitting: Internal Medicine

## 2017-02-18 DIAGNOSIS — N6310 Unspecified lump in the right breast, unspecified quadrant: Secondary | ICD-10-CM

## 2017-02-18 DIAGNOSIS — N6311 Unspecified lump in the right breast, upper outer quadrant: Secondary | ICD-10-CM | POA: Diagnosis not present

## 2017-02-18 DIAGNOSIS — R928 Other abnormal and inconclusive findings on diagnostic imaging of breast: Secondary | ICD-10-CM | POA: Diagnosis not present

## 2017-02-20 ENCOUNTER — Other Ambulatory Visit: Payer: Self-pay | Admitting: Internal Medicine

## 2017-02-20 DIAGNOSIS — E119 Type 2 diabetes mellitus without complications: Secondary | ICD-10-CM | POA: Diagnosis not present

## 2017-02-20 DIAGNOSIS — R600 Localized edema: Secondary | ICD-10-CM | POA: Diagnosis not present

## 2017-02-20 DIAGNOSIS — Z0000 Encounter for general adult medical examination without abnormal findings: Secondary | ICD-10-CM | POA: Diagnosis not present

## 2017-02-20 DIAGNOSIS — R928 Other abnormal and inconclusive findings on diagnostic imaging of breast: Secondary | ICD-10-CM

## 2017-02-20 DIAGNOSIS — M1711 Unilateral primary osteoarthritis, right knee: Secondary | ICD-10-CM | POA: Diagnosis not present

## 2017-02-20 DIAGNOSIS — C73 Malignant neoplasm of thyroid gland: Secondary | ICD-10-CM | POA: Diagnosis not present

## 2017-02-20 DIAGNOSIS — R911 Solitary pulmonary nodule: Secondary | ICD-10-CM | POA: Diagnosis not present

## 2017-02-20 DIAGNOSIS — N6310 Unspecified lump in the right breast, unspecified quadrant: Secondary | ICD-10-CM

## 2017-02-20 DIAGNOSIS — N390 Urinary tract infection, site not specified: Secondary | ICD-10-CM | POA: Diagnosis not present

## 2017-02-20 DIAGNOSIS — I10 Essential (primary) hypertension: Secondary | ICD-10-CM | POA: Diagnosis not present

## 2017-02-24 DIAGNOSIS — Z85850 Personal history of malignant neoplasm of thyroid: Secondary | ICD-10-CM | POA: Diagnosis not present

## 2017-02-24 DIAGNOSIS — E890 Postprocedural hypothyroidism: Secondary | ICD-10-CM | POA: Diagnosis not present

## 2017-03-11 ENCOUNTER — Ambulatory Visit
Admission: RE | Admit: 2017-03-11 | Discharge: 2017-03-11 | Disposition: A | Discharge status: Home / Self Care | Payer: PPO | Source: Ambulatory Visit | Attending: Internal Medicine | Admitting: Internal Medicine

## 2017-03-11 DIAGNOSIS — N6341 Unspecified lump in right breast, subareolar: Secondary | ICD-10-CM | POA: Diagnosis not present

## 2017-03-11 DIAGNOSIS — N6310 Unspecified lump in the right breast, unspecified quadrant: Secondary | ICD-10-CM

## 2017-03-11 DIAGNOSIS — N6311 Unspecified lump in the right breast, upper outer quadrant: Secondary | ICD-10-CM | POA: Diagnosis not present

## 2017-03-11 DIAGNOSIS — R928 Other abnormal and inconclusive findings on diagnostic imaging of breast: Secondary | ICD-10-CM

## 2017-03-11 DIAGNOSIS — N63 Unspecified lump in unspecified breast: Secondary | ICD-10-CM | POA: Diagnosis not present

## 2017-03-11 HISTORY — PX: BREAST BIOPSY: SHX20

## 2017-03-12 IMAGING — PT NM PET TUM IMG INITIAL (PI) SKULL BASE T - THIGH
1 of 9 series · 2 of 25 positions shown · non-contrast
Comparison: CT chest dated 11/12/2016. PET-CT dated 06/29/2013. CT
chest dated 06/21/2013.

CLINICAL DATA: Initial treatment strategy for lung nodule.

EXAM:
NUCLEAR MEDICINE PET SKULL BASE TO THIGH
TECHNIQUE: 13.0 mCi F-18 FDG was injected intravenously. Full-ring PET imaging
was performed from the skull base to thigh after the radiotracer. CT
data was obtained and used for attenuation correction and anatomic
localization.
FASTING BLOOD GLUCOSE:  Value: 182 mg/dl

[Series 3: ct wb 5.0 b30f · axial · 5.0mm · 0.98mm/px · z∈[-985,-550]mm · 2 of 290 slices shown]
[im 145/290  brain]
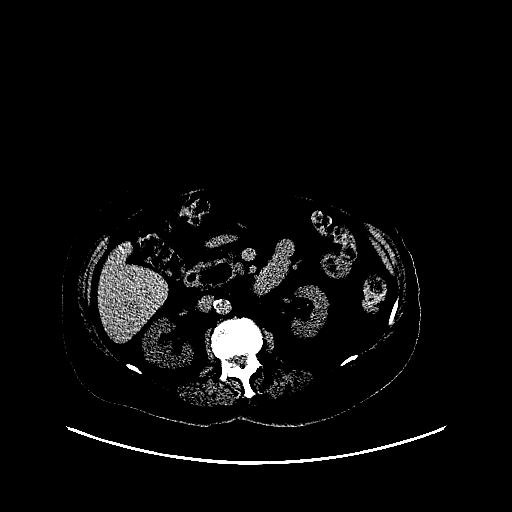
[im 290/290  brain]
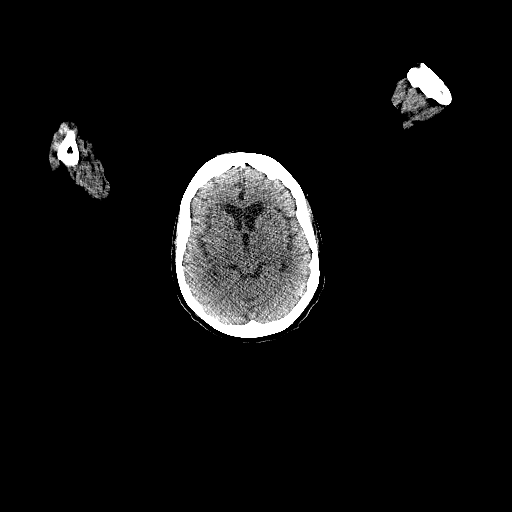

[2 of 25 positions shown; findings below may reference images not displayed]

FINDINGS: NECK

No hypermetabolic lymph nodes in the neck.

CHEST

[DATE] x 1.4 cm left upper lobe pulmonary nodule, measuring 1.6 x
cm in 3604, max SUV 1.9. A benign lesion such as hamartoma is
favored.

Heart is normal in size. No pericardial effusion. Atherosclerotic
calcifications of the aortic arch.

No hypermetabolic thoracic lymphadenopathy.

ABDOMEN/PELVIS

No abnormal hypermetabolic activity within the liver, pancreas, or
spleen.

1.4 x 1.9 cm left adrenal nodule, measuring 1.4 x 1.7 cm in 3604,
max SUV 3.6. Low-density medially within the lesion measures less
than 10 HUs, characteristic of a benign adrenal adenoma.

No hypermetabolic lymph nodes in the abdomen or pelvis.

SKELETON

No focal hypermetabolic activity to suggest skeletal metastasis.
IMPRESSION: 1.7 x 1.4 cm left upper lobe pulmonary nodule, minimally increased
when compared to 3604, with only mild hypermetabolism. A benign
lesion such as hamartoma is favored.

1.4 x 1.9 cm left adrenal nodule, mildly FDG avid, compatible with a
benign adrenal adenoma.

No findings suspicious for malignancy.

## 2017-03-13 LAB — SURGICAL PATHOLOGY

## 2017-06-17 DIAGNOSIS — Z0000 Encounter for general adult medical examination without abnormal findings: Secondary | ICD-10-CM | POA: Diagnosis not present

## 2017-06-17 DIAGNOSIS — E119 Type 2 diabetes mellitus without complications: Secondary | ICD-10-CM | POA: Diagnosis not present

## 2017-06-17 DIAGNOSIS — R600 Localized edema: Secondary | ICD-10-CM | POA: Diagnosis not present

## 2017-06-17 DIAGNOSIS — I10 Essential (primary) hypertension: Secondary | ICD-10-CM | POA: Diagnosis not present

## 2017-06-22 DIAGNOSIS — E119 Type 2 diabetes mellitus without complications: Secondary | ICD-10-CM | POA: Diagnosis not present

## 2017-06-22 DIAGNOSIS — M1990 Unspecified osteoarthritis, unspecified site: Secondary | ICD-10-CM | POA: Diagnosis not present

## 2017-06-22 DIAGNOSIS — I10 Essential (primary) hypertension: Secondary | ICD-10-CM | POA: Diagnosis not present

## 2017-06-22 DIAGNOSIS — R0602 Shortness of breath: Secondary | ICD-10-CM | POA: Diagnosis not present

## 2017-06-22 DIAGNOSIS — C73 Malignant neoplasm of thyroid gland: Secondary | ICD-10-CM | POA: Diagnosis not present

## 2017-08-06 DIAGNOSIS — M1990 Unspecified osteoarthritis, unspecified site: Secondary | ICD-10-CM | POA: Diagnosis not present

## 2017-08-06 DIAGNOSIS — C73 Malignant neoplasm of thyroid gland: Secondary | ICD-10-CM | POA: Diagnosis not present

## 2017-08-06 DIAGNOSIS — I10 Essential (primary) hypertension: Secondary | ICD-10-CM | POA: Diagnosis not present

## 2017-08-06 DIAGNOSIS — M109 Gout, unspecified: Secondary | ICD-10-CM | POA: Diagnosis not present

## 2017-08-06 DIAGNOSIS — E119 Type 2 diabetes mellitus without complications: Secondary | ICD-10-CM | POA: Diagnosis not present

## 2017-08-18 DIAGNOSIS — E890 Postprocedural hypothyroidism: Secondary | ICD-10-CM | POA: Diagnosis not present

## 2017-08-18 DIAGNOSIS — J40 Bronchitis, not specified as acute or chronic: Secondary | ICD-10-CM | POA: Diagnosis not present

## 2017-08-24 DIAGNOSIS — M10071 Idiopathic gout, right ankle and foot: Secondary | ICD-10-CM | POA: Diagnosis not present

## 2017-08-24 DIAGNOSIS — M79671 Pain in right foot: Secondary | ICD-10-CM | POA: Diagnosis not present

## 2017-08-24 DIAGNOSIS — E119 Type 2 diabetes mellitus without complications: Secondary | ICD-10-CM | POA: Diagnosis not present

## 2017-08-27 DIAGNOSIS — E890 Postprocedural hypothyroidism: Secondary | ICD-10-CM | POA: Diagnosis not present

## 2017-08-27 DIAGNOSIS — Z85850 Personal history of malignant neoplasm of thyroid: Secondary | ICD-10-CM | POA: Diagnosis not present

## 2017-10-19 DIAGNOSIS — C73 Malignant neoplasm of thyroid gland: Secondary | ICD-10-CM | POA: Diagnosis not present

## 2017-10-19 DIAGNOSIS — E119 Type 2 diabetes mellitus without complications: Secondary | ICD-10-CM | POA: Diagnosis not present

## 2017-10-19 DIAGNOSIS — R0602 Shortness of breath: Secondary | ICD-10-CM | POA: Diagnosis not present

## 2017-10-19 DIAGNOSIS — I10 Essential (primary) hypertension: Secondary | ICD-10-CM | POA: Diagnosis not present

## 2017-10-19 DIAGNOSIS — M1990 Unspecified osteoarthritis, unspecified site: Secondary | ICD-10-CM | POA: Diagnosis not present

## 2017-10-26 DIAGNOSIS — M1990 Unspecified osteoarthritis, unspecified site: Secondary | ICD-10-CM | POA: Diagnosis not present

## 2017-10-26 DIAGNOSIS — Z6830 Body mass index (BMI) 30.0-30.9, adult: Secondary | ICD-10-CM | POA: Diagnosis not present

## 2017-10-26 DIAGNOSIS — E039 Hypothyroidism, unspecified: Secondary | ICD-10-CM | POA: Diagnosis not present

## 2017-10-26 DIAGNOSIS — Z0000 Encounter for general adult medical examination without abnormal findings: Secondary | ICD-10-CM | POA: Diagnosis not present

## 2017-10-26 DIAGNOSIS — E782 Mixed hyperlipidemia: Secondary | ICD-10-CM | POA: Diagnosis not present

## 2017-10-26 DIAGNOSIS — I10 Essential (primary) hypertension: Secondary | ICD-10-CM | POA: Diagnosis not present

## 2017-12-21 DIAGNOSIS — E119 Type 2 diabetes mellitus without complications: Secondary | ICD-10-CM | POA: Diagnosis not present

## 2018-01-11 DIAGNOSIS — Z85820 Personal history of malignant melanoma of skin: Secondary | ICD-10-CM | POA: Diagnosis not present

## 2018-01-11 DIAGNOSIS — Z85828 Personal history of other malignant neoplasm of skin: Secondary | ICD-10-CM | POA: Diagnosis not present

## 2018-01-11 DIAGNOSIS — D485 Neoplasm of uncertain behavior of skin: Secondary | ICD-10-CM | POA: Diagnosis not present

## 2018-01-11 DIAGNOSIS — I8393 Asymptomatic varicose veins of bilateral lower extremities: Secondary | ICD-10-CM | POA: Diagnosis not present

## 2018-01-11 DIAGNOSIS — D229 Melanocytic nevi, unspecified: Secondary | ICD-10-CM | POA: Diagnosis not present

## 2018-01-11 DIAGNOSIS — D1800 Hemangioma unspecified site: Secondary | ICD-10-CM | POA: Diagnosis not present

## 2018-01-11 DIAGNOSIS — Z1283 Encounter for screening for malignant neoplasm of skin: Secondary | ICD-10-CM | POA: Diagnosis not present

## 2018-01-11 DIAGNOSIS — L820 Inflamed seborrheic keratosis: Secondary | ICD-10-CM | POA: Diagnosis not present

## 2018-01-11 DIAGNOSIS — L821 Other seborrheic keratosis: Secondary | ICD-10-CM | POA: Diagnosis not present

## 2018-01-11 DIAGNOSIS — L570 Actinic keratosis: Secondary | ICD-10-CM | POA: Diagnosis not present

## 2018-02-18 DIAGNOSIS — Z0000 Encounter for general adult medical examination without abnormal findings: Secondary | ICD-10-CM | POA: Diagnosis not present

## 2018-02-18 DIAGNOSIS — E119 Type 2 diabetes mellitus without complications: Secondary | ICD-10-CM | POA: Diagnosis not present

## 2018-02-18 DIAGNOSIS — E039 Hypothyroidism, unspecified: Secondary | ICD-10-CM | POA: Diagnosis not present

## 2018-02-18 DIAGNOSIS — Z6830 Body mass index (BMI) 30.0-30.9, adult: Secondary | ICD-10-CM | POA: Diagnosis not present

## 2018-02-18 DIAGNOSIS — E782 Mixed hyperlipidemia: Secondary | ICD-10-CM | POA: Diagnosis not present

## 2018-02-18 DIAGNOSIS — M1990 Unspecified osteoarthritis, unspecified site: Secondary | ICD-10-CM | POA: Diagnosis not present

## 2018-02-18 DIAGNOSIS — I10 Essential (primary) hypertension: Secondary | ICD-10-CM | POA: Diagnosis not present

## 2018-02-25 DIAGNOSIS — I10 Essential (primary) hypertension: Secondary | ICD-10-CM | POA: Diagnosis not present

## 2018-02-25 DIAGNOSIS — C73 Malignant neoplasm of thyroid gland: Secondary | ICD-10-CM | POA: Diagnosis not present

## 2018-02-25 DIAGNOSIS — Z0000 Encounter for general adult medical examination without abnormal findings: Secondary | ICD-10-CM | POA: Diagnosis not present

## 2018-02-25 DIAGNOSIS — E039 Hypothyroidism, unspecified: Secondary | ICD-10-CM | POA: Diagnosis not present

## 2018-02-25 DIAGNOSIS — E782 Mixed hyperlipidemia: Secondary | ICD-10-CM | POA: Diagnosis not present

## 2018-02-25 DIAGNOSIS — D649 Anemia, unspecified: Secondary | ICD-10-CM | POA: Diagnosis not present

## 2018-02-25 DIAGNOSIS — E119 Type 2 diabetes mellitus without complications: Secondary | ICD-10-CM | POA: Diagnosis not present

## 2018-02-25 DIAGNOSIS — Z85828 Personal history of other malignant neoplasm of skin: Secondary | ICD-10-CM | POA: Diagnosis not present

## 2018-04-26 ENCOUNTER — Other Ambulatory Visit: Payer: Self-pay | Admitting: Physician Assistant

## 2018-04-26 DIAGNOSIS — G44229 Chronic tension-type headache, not intractable: Secondary | ICD-10-CM | POA: Diagnosis not present

## 2018-04-26 DIAGNOSIS — L821 Other seborrheic keratosis: Secondary | ICD-10-CM | POA: Diagnosis not present

## 2018-04-26 DIAGNOSIS — M542 Cervicalgia: Secondary | ICD-10-CM | POA: Diagnosis not present

## 2018-04-26 DIAGNOSIS — L570 Actinic keratosis: Secondary | ICD-10-CM | POA: Diagnosis not present

## 2018-04-26 DIAGNOSIS — R410 Disorientation, unspecified: Secondary | ICD-10-CM

## 2018-04-26 DIAGNOSIS — D229 Melanocytic nevi, unspecified: Secondary | ICD-10-CM | POA: Diagnosis not present

## 2018-04-30 ENCOUNTER — Ambulatory Visit
Admission: RE | Admit: 2018-04-30 | Discharge: 2018-04-30 | Disposition: A | Discharge status: Home / Self Care | Payer: PPO | Source: Ambulatory Visit | Attending: Physician Assistant | Admitting: Physician Assistant

## 2018-04-30 DIAGNOSIS — I6782 Cerebral ischemia: Secondary | ICD-10-CM | POA: Diagnosis not present

## 2018-04-30 DIAGNOSIS — R410 Disorientation, unspecified: Secondary | ICD-10-CM | POA: Diagnosis not present

## 2018-04-30 DIAGNOSIS — G44229 Chronic tension-type headache, not intractable: Secondary | ICD-10-CM | POA: Diagnosis not present

## 2018-04-30 LAB — POCT I-STAT CREATININE: Creatinine, Ser: 0.9 mg/dL (ref 0.44–1.00)

## 2018-04-30 MED ORDER — GADOBENATE DIMEGLUMINE 529 MG/ML IV SOLN
20.0000 mL | Freq: Once | INTRAVENOUS | Status: AC | PRN
  Administered 2018-04-30: 17 mL via INTRAVENOUS

## 2018-06-21 DIAGNOSIS — E119 Type 2 diabetes mellitus without complications: Secondary | ICD-10-CM | POA: Diagnosis not present

## 2018-06-21 DIAGNOSIS — E039 Hypothyroidism, unspecified: Secondary | ICD-10-CM | POA: Diagnosis not present

## 2018-06-21 DIAGNOSIS — C73 Malignant neoplasm of thyroid gland: Secondary | ICD-10-CM | POA: Diagnosis not present

## 2018-06-21 DIAGNOSIS — Z85828 Personal history of other malignant neoplasm of skin: Secondary | ICD-10-CM | POA: Diagnosis not present

## 2018-06-21 DIAGNOSIS — E782 Mixed hyperlipidemia: Secondary | ICD-10-CM | POA: Diagnosis not present

## 2018-06-21 DIAGNOSIS — D649 Anemia, unspecified: Secondary | ICD-10-CM | POA: Diagnosis not present

## 2018-06-21 DIAGNOSIS — I10 Essential (primary) hypertension: Secondary | ICD-10-CM | POA: Diagnosis not present

## 2018-06-28 DIAGNOSIS — I10 Essential (primary) hypertension: Secondary | ICD-10-CM | POA: Diagnosis not present

## 2018-06-28 DIAGNOSIS — R413 Other amnesia: Secondary | ICD-10-CM | POA: Diagnosis not present

## 2018-06-28 DIAGNOSIS — M159 Polyosteoarthritis, unspecified: Secondary | ICD-10-CM | POA: Diagnosis not present

## 2018-06-28 DIAGNOSIS — E1165 Type 2 diabetes mellitus with hyperglycemia: Secondary | ICD-10-CM | POA: Diagnosis not present

## 2018-06-28 DIAGNOSIS — E039 Hypothyroidism, unspecified: Secondary | ICD-10-CM | POA: Diagnosis not present

## 2018-06-28 DIAGNOSIS — Z23 Encounter for immunization: Secondary | ICD-10-CM | POA: Diagnosis not present

## 2018-06-28 DIAGNOSIS — E538 Deficiency of other specified B group vitamins: Secondary | ICD-10-CM | POA: Diagnosis not present

## 2018-06-28 DIAGNOSIS — E782 Mixed hyperlipidemia: Secondary | ICD-10-CM | POA: Diagnosis not present

## 2018-07-01 DIAGNOSIS — Z8669 Personal history of other diseases of the nervous system and sense organs: Secondary | ICD-10-CM | POA: Diagnosis not present

## 2018-07-01 DIAGNOSIS — R413 Other amnesia: Secondary | ICD-10-CM | POA: Diagnosis not present

## 2018-07-01 DIAGNOSIS — R296 Repeated falls: Secondary | ICD-10-CM | POA: Diagnosis not present

## 2018-07-16 ENCOUNTER — Emergency Department: Payer: PPO

## 2018-07-16 ENCOUNTER — Other Ambulatory Visit: Payer: Self-pay

## 2018-07-16 ENCOUNTER — Emergency Department
Admission: EM | Admit: 2018-07-16 | Discharge: 2018-07-17 | Disposition: A | Discharge status: Skilled Nursing Facility | Payer: PPO | Attending: Emergency Medicine | Admitting: Emergency Medicine

## 2018-07-16 DIAGNOSIS — I10 Essential (primary) hypertension: Secondary | ICD-10-CM | POA: Insufficient documentation

## 2018-07-16 DIAGNOSIS — Z8673 Personal history of transient ischemic attack (TIA), and cerebral infarction without residual deficits: Secondary | ICD-10-CM | POA: Diagnosis not present

## 2018-07-16 DIAGNOSIS — Y92003 Bedroom of unspecified non-institutional (private) residence as the place of occurrence of the external cause: Secondary | ICD-10-CM | POA: Diagnosis not present

## 2018-07-16 DIAGNOSIS — M79604 Pain in right leg: Secondary | ICD-10-CM | POA: Diagnosis not present

## 2018-07-16 DIAGNOSIS — E119 Type 2 diabetes mellitus without complications: Secondary | ICD-10-CM | POA: Insufficient documentation

## 2018-07-16 DIAGNOSIS — W1839XA Other fall on same level, initial encounter: Secondary | ICD-10-CM | POA: Diagnosis not present

## 2018-07-16 DIAGNOSIS — Z79899 Other long term (current) drug therapy: Secondary | ICD-10-CM | POA: Insufficient documentation

## 2018-07-16 DIAGNOSIS — W19XXXA Unspecified fall, initial encounter: Secondary | ICD-10-CM

## 2018-07-16 DIAGNOSIS — M25561 Pain in right knee: Secondary | ICD-10-CM | POA: Diagnosis not present

## 2018-07-16 DIAGNOSIS — M25551 Pain in right hip: Secondary | ICD-10-CM | POA: Diagnosis not present

## 2018-07-16 DIAGNOSIS — S79911A Unspecified injury of right hip, initial encounter: Secondary | ICD-10-CM | POA: Diagnosis not present

## 2018-07-16 DIAGNOSIS — Y9389 Activity, other specified: Secondary | ICD-10-CM | POA: Insufficient documentation

## 2018-07-16 DIAGNOSIS — Y999 Unspecified external cause status: Secondary | ICD-10-CM | POA: Insufficient documentation

## 2018-07-16 DIAGNOSIS — Z7984 Long term (current) use of oral hypoglycemic drugs: Secondary | ICD-10-CM | POA: Insufficient documentation

## 2018-07-16 DIAGNOSIS — M79661 Pain in right lower leg: Secondary | ICD-10-CM | POA: Diagnosis not present

## 2018-07-16 DIAGNOSIS — E039 Hypothyroidism, unspecified: Secondary | ICD-10-CM | POA: Diagnosis not present

## 2018-07-16 DIAGNOSIS — S8991XA Unspecified injury of right lower leg, initial encounter: Secondary | ICD-10-CM | POA: Diagnosis not present

## 2018-07-16 DIAGNOSIS — M25461 Effusion, right knee: Secondary | ICD-10-CM | POA: Diagnosis not present

## 2018-07-16 DIAGNOSIS — Z85828 Personal history of other malignant neoplasm of skin: Secondary | ICD-10-CM | POA: Diagnosis not present

## 2018-07-16 DIAGNOSIS — J45909 Unspecified asthma, uncomplicated: Secondary | ICD-10-CM | POA: Diagnosis not present

## 2018-07-16 DIAGNOSIS — M79651 Pain in right thigh: Secondary | ICD-10-CM | POA: Diagnosis not present

## 2018-07-16 LAB — BASIC METABOLIC PANEL
Anion gap: 9 (ref 5–15)
BUN: 26 mg/dL — AB (ref 8–23)
CHLORIDE: 105 mmol/L (ref 98–111)
CO2: 27 mmol/L (ref 22–32)
CREATININE: 1.15 mg/dL — AB (ref 0.44–1.00)
Calcium: 8.9 mg/dL (ref 8.9–10.3)
GFR calc Af Amer: 51 mL/min — ABNORMAL LOW (ref >60)
GFR calc non Af Amer: 44 mL/min — ABNORMAL LOW (ref >60)
Glucose, Bld: 161 mg/dL — ABNORMAL HIGH (ref 70–99)
Potassium: 3 mmol/L — ABNORMAL LOW (ref 3.5–5.1)
Sodium: 141 mmol/L (ref 135–145)

## 2018-07-16 LAB — URINALYSIS, COMPLETE (UACMP) WITH MICROSCOPIC
BACTERIA UA: NONE SEEN
Bilirubin Urine: NEGATIVE
Glucose, UA: NEGATIVE mg/dL
Hgb urine dipstick: NEGATIVE
Ketones, ur: NEGATIVE mg/dL
Leukocytes, UA: NEGATIVE
Nitrite: NEGATIVE
Protein, ur: NEGATIVE mg/dL
SPECIFIC GRAVITY, URINE: 1.011 (ref 1.005–1.030)
pH: 6 (ref 5.0–8.0)

## 2018-07-16 LAB — CBC
HEMATOCRIT: 36.5 % (ref 36.0–46.0)
HEMOGLOBIN: 12.1 g/dL (ref 12.0–15.0)
MCH: 31 pg (ref 26.0–34.0)
MCHC: 33.2 g/dL (ref 30.0–36.0)
MCV: 93.6 fL (ref 80.0–100.0)
Platelets: 366 10*3/uL (ref 150–400)
RBC: 3.9 MIL/uL (ref 3.87–5.11)
RDW: 13.3 % (ref 11.5–15.5)
WBC: 9.8 10*3/uL (ref 4.0–10.5)
nRBC: 0 % (ref 0.0–0.2)

## 2018-07-16 MED ORDER — FENTANYL CITRATE (PF) 100 MCG/2ML IJ SOLN
50.0000 ug | Freq: Once | INTRAMUSCULAR | Status: AC
  Administered 2018-07-16: 50 ug via INTRAVENOUS
  Filled 2018-07-16: qty 2

## 2018-07-16 MED ORDER — LISINOPRIL 10 MG PO TABS: 40.0000 mg | ORAL_TABLET | Freq: Every day | ORAL | Status: DC

## 2018-07-16 MED ORDER — METFORMIN HCL 500 MG PO TABS
500.0000 mg | ORAL_TABLET | Freq: Two times a day (BID) | ORAL | Status: DC
  Administered 2018-07-17 (×2): 500 mg via ORAL
  Filled 2018-07-16 (×2): qty 1

## 2018-07-16 MED ORDER — POTASSIUM CHLORIDE CRYS ER 20 MEQ PO TBCR
20.0000 meq | EXTENDED_RELEASE_TABLET | Freq: Once | ORAL | Status: AC
  Administered 2018-07-17: 20 meq via ORAL
  Filled 2018-07-16: qty 1

## 2018-07-16 MED ORDER — OXYCODONE-ACETAMINOPHEN 5-325 MG PO TABS
0.5000 | ORAL_TABLET | ORAL | Status: AC
  Administered 2018-07-16: 0.5 via ORAL
  Filled 2018-07-16: qty 1

## 2018-07-16 MED ORDER — LEVOTHYROXINE SODIUM 50 MCG PO TABS
150.0000 ug | ORAL_TABLET | Freq: Every day | ORAL | Status: DC
  Administered 2018-07-17: 150 ug via ORAL
  Filled 2018-07-16: qty 3

## 2018-07-16 MED ORDER — IBUPROFEN 400 MG PO TABS
400.0000 mg | ORAL_TABLET | ORAL | Status: AC
  Administered 2018-07-16: 400 mg via ORAL
  Filled 2018-07-16: qty 1

## 2018-07-16 NOTE — ED Notes (Signed)
MRI states will be here in 10 minutes to take pt to MRI.

## 2018-07-16 NOTE — ED Notes (Signed)
Patient transported to X-ray 

## 2018-07-16 NOTE — ED Notes (Signed)
Pt returned from xray

## 2018-07-16 NOTE — ED Triage Notes (Signed)
Pt comes from home via [facility] after a fall this morning. She c/o right knee pain. Knee is slightly swollen and pt reports not being able to move it. Per daughter she has been falling a lot in the past couple of days. BP192/83 and HR 92. Pt also reports not taking her BP med today.

## 2018-07-16 NOTE — ED Notes (Signed)
Patient transported to MRI 

## 2018-07-16 NOTE — ED Provider Notes (Signed)
[facility] Emergency Department Provider Note ____________________________________________   First MD Initiated Contact with Patient 07/16/18 1854     (approximate)  I have reviewed the triage vital signs and the nursing notes.   HISTORY  Chief Complaint Fall  HPI BRIANAH HOPSON is a 77 y.o. female   previous history of diabetes, osteoarthritis  Patient presents after a fall.  She reports she was getting out of her bed, she fell onto the floor landing on her right knee and hip.  She denies head injury.  No neck pain.  Reports she was unable to get herself up due to pain, prompting her 911 call.  She has held felt fatigued for several weeks time, and reports she had a urinary tract infection not long ago but those symptoms have since cleared after antibiotics.  No chest pain or trouble breathing.  No injury to the arms or left leg, reports pain primarily over the right mid thigh.  Past Medical History:  Diagnosis Date  . Arthritis    all joints  . Asthma   . Cough    chronic  . Diabetes mellitus without complication ([place])   . Dysrhythmia    palpatations  . GERD (gastroesophageal reflux disease)   . Hypertension   . Hypothyroidism   . Osteopenia   . Peripheral vascular disease ([facility])    varicose veins  . Skin cancer    melanoma and squamous cell  . Spinal stenosis   . Thyroid cancer ([place]) 2016   Thyroidectomy  . TIA (transient ischemic attack) 2012   no deficits    There are no active problems to display for this patient.   Past Surgical History:  Procedure Laterality Date  . BREAST BIOPSY Right 03/11/2017   path pending [place] bx  . BREAST CYST ASPIRATION Bilateral 1980's  . BREAST EXCISIONAL BIOPSY Right 1980's   benign  . CATARACT EXTRACTION Left 01/17/15   [facility]  . CATARACT EXTRACTION W/PHACO Right 02/28/2015   Procedure: CATARACT EXTRACTION PHACO AND INTRAOCULAR LENS PLACEMENT ([facility]);  Surgeon: Leandrew Koyanagi, MD;  Location: [facility];  Service: Ophthalmology;  Laterality: Right;  IVA BLOCK TORIC LENS DIABETIC  . CHOLECYSTECTOMY    . HAND SURGERY    . JOINT REPLACEMENT     knee  . MIDDLE EAR SURGERY    . ORIF FIBULA FRACTURE    . SKIN CANCER EXCISION    . TONSILLECTOMY    . WRIST SURGERY      Prior to Admission medications   Medication Sig Start Date End Date Taking? Authorizing Provider  amitriptyline (ELAVIL) 50 MG tablet Take 50 mg by mouth at bedtime.   Yes [provider]  aspirin 81 MG tablet Take 81 mg by mouth 3 (three) times a week.   Yes [provider]  Calcium Carbonate-Vitamin D (CALCIUM + D PO) Take by mouth.   Yes [provider]  ferrous sulfate 325 (65 FE) MG tablet Take 325 mg by mouth daily with breakfast.   Yes [provider]  glimepiride (AMARYL) 2 MG tablet Take 2 mg by mouth daily with breakfast.   Yes [provider]  levothyroxine (SYNTHROID, LEVOTHROID) 150 MCG tablet Take 150 mcg by mouth daily before breakfast. 06/14/18  Yes [provider]  lisinopril (PRINIVIL,ZESTRIL) 40 MG tablet Take 40 mg by mouth daily. AM   Yes [provider]  metFORMIN (GLUCOPHAGE) 500 MG tablet Take by mouth 2 (two) times daily with a meal.  Yes [provider]  multivitamin-iron-minerals-folic acid (CENTRUM) chewable tablet Chew 1 tablet by mouth daily.   Yes [provider]  pravastatin (PRAVACHOL) 20 MG tablet Take 20 mg by mouth daily.    Yes [provider]  topiramate (TOPAMAX) 25 MG tablet Take 25 mg by mouth daily.   Yes [provider]  triamterene-hydrochlorothiazide (DYAZIDE) 37.5-25 MG per capsule Take 0.5 capsules by mouth daily. AM    Yes [provider]    Allergies Adhesive [tape]; Boniva [ibandronic acid]; Meloxicam; Neosporin [neomycin-bacitracin zn-polymyx]; and Tramadol  History reviewed. No pertinent family history.  Social History Social History   Tobacco Use  .  Smoking status: Former Smoker    Last attempt to quit: 09/29/1993    Years since quitting: 24.8  Substance Use Topics  . Alcohol use: No  . Drug use: Not on file  No illicit drug use  Review of Systems Constitutional: No fever/chills but somewhat fatigued for several weeks.  Normally uses a walker. Eyes: No visual changes. ENT: No sore throat. Cardiovascular: Denies chest pain. Respiratory: Denies shortness of breath. Gastrointestinal: No abdominal pain.   Genitourinary: Negative for dysuria. Musculoskeletal: Negative for back pain.  See HPI.  No neck pain.  Denies striking her head or head injury. Skin: Negative for rash. Neurological: Negative for headaches, areas of focal weakness or numbness.    ____________________________________________   PHYSICAL EXAM:  VITAL SIGNS: ED Triage Vitals  Enc Vitals Group     BP 07/16/18 1845 (!) 186/64     Pulse Rate 07/16/18 1845 86     Resp 07/16/18 1845 18     Temp 07/16/18 1845 98.1 F (36.7 C)     Temp Source 07/16/18 1845 Oral     SpO2 07/16/18 1845 100 %     Weight 07/16/18 1847 170 lb (77.1 kg)     Height 07/16/18 1847 5\' 1"  (1.549 m)     Head Circumference --      Peak Flow --      Pain Score 07/16/18 1846 0     Pain Loc --      Pain Edu? --      Excl. in [place]? --     Constitutional: Alert and oriented. Well appearing and in no acute distress. Eyes: Conjunctivae are normal. Head: Atraumatic. Nose: No congestion/rhinnorhea. Mouth/Throat: Mucous membranes are moist. Neck: No stridor.  Cardiovascular: Normal rate, regular rhythm. Grossly normal heart sounds.  Good peripheral circulation. Respiratory: Normal respiratory effort.  No retractions. Lungs CTAB. Gastrointestinal: Soft and nontender. No distention. Musculoskeletal:   RIGHT Right upper extremity demonstrates normal strength, good use of all muscles. No edema bruising or contusions of the right shoulder/upper arm, right elbow, right forearm / hand. Full range of  motion of the right right upper extremity without pain. No evidence of trauma. Strong radial pulse. Intact median/ulnar/radial neuro-muscular exam.  LEFT Left upper extremity demonstrates normal strength, good use of all muscles. No edema bruising or contusions of the left shoulder/upper arm, left elbow, left forearm / hand. Full range of motion of the left  upper extremity without pain. No evidence of trauma. Strong radial pulse. Intact median/ulnar/radial neuro-muscular exam.  Lower Extremities  No edema. Normal DP/PT pulses bilateral with good cap refill.  Normal neuro-motor function lower extremities bilateral.  RIGHT Right  lower extremity demonstrates decreased range of motion due to pain over the right upper thigh and hip region.. No edema bruising or contusions of the hip,  knee, ankle.  Good range of motion of the ankle and foot without pain or discomfort to palpation.  No pain to palpation along bones distal to the proximal femur. No pain on axial loading. No evidence of trauma.  LEFT Right lower extremity demonstrates decreased range of motion due to pain over the right upper thigh and hip region.. No edema bruising or contusions of the hip,  knee, ankle.  Good range of motion of the ankle and foot without pain or discomfort to palpation. Patient does report tenderness however over the right proximal femur no pain on axial loading. No evidence of trauma.   Neurologic:  Normal speech and language. No gross focal neurologic deficits are appreciated.  Skin:  Skin is warm, dry and intact. No rash noted. Psychiatric: Mood and affect are normal. Speech and behavior are normal.  ____________________________________________   LABS (all labs ordered are listed, but only abnormal results are displayed)  Labs Reviewed  BASIC METABOLIC PANEL - Abnormal; Notable for the following components:      Result Value   Potassium 3.0 (*)    Glucose, Bld 161 (*)    BUN 26 (*)    Creatinine, Ser  1.15 (*)    GFR calc non Af Amer 44 (*)    GFR calc Af Amer 51 (*)    All other components within normal limits  URINALYSIS, COMPLETE (UACMP) WITH MICROSCOPIC - Abnormal; Notable for the following components:   Color, Urine YELLOW (*)    APPearance CLEAR (*)    All other components within normal limits  CBC   ____________________________________________  EKG  Reviewed and interpreted by me at 1910 Heart rate 85 QRS 100 QTc 480 Normal sinus rhythm, no evidence of ischemia ____________________________________________  RADIOLOGY  Dg Pelvis 1-2 Views  Result Date: 07/16/2018 CLINICAL DATA:  Fall with right leg pain EXAM: PELVIS - 1-2 VIEW COMPARISON:  PET-CT 12/04/2016 FINDINGS: There is no evidence of pelvic fracture or diastasis. No pelvic bone lesions are seen. Partially seen lumbar spine degeneration with scoliosis. IMPRESSION: No acute finding. Electronically Signed   By: Monte Fantasia M.D.   On: 07/16/2018 20:24   Dg Tibia/fibula Right  Result Date: 07/16/2018 CLINICAL DATA:  Right leg pain due to a fall today. Initial encounter. EXAM: RIGHT TIBIA AND FIBULA - 2 VIEW COMPARISON:  Plain films right knee 01/15/2018. FINDINGS: There is no acute bony or joint abnormality. The patient has advanced osteoarthritis about the right knee which shows some worsening since the prior exam. Small right knee joint effusion is noted. Soft tissues unremarkable. IMPRESSION: No acute abnormality. Advanced osteoarthritis right knee with an associated small effusion. Electronically Signed   By: Inge Rise M.D.   On: 07/16/2018 20:25   Dg Femur Min 2 Views Right  Result Date: 07/16/2018 CLINICAL DATA:  Right leg pain due to a fall this morning. Initial encounter. EXAM: RIGHT FEMUR 2 VIEWS COMPARISON:  None. FINDINGS: There is no acute bony or joint abnormality. Mild degenerative change is present about the right hip. The patient has advanced osteoarthritis about the right knee. Soft tissues  are unremarkable. IMPRESSION: No acute abnormality. Right knee much worse than right hip osteoarthritis. Electronically Signed   By: Inge Rise M.D.   On: 07/16/2018 20:24     MRI right hip pending at time of signout.  Dr. Mable Paris to follow-up. ____________________________________________   PROCEDURES  Procedure(s) performed: None  Procedures  Critical Care performed: No   ____________________________________________   INITIAL IMPRESSION / ASSESSMENT AND PLAN /  ED COURSE  Pertinent labs & imaging results that were available during my care of the patient were reviewed by me and considered in my medical decision making (see chart for details).   Patient presents after a fall out of bed.  Reports pain and discomfort over her right hip and knee region.  No obvious trauma on examination, but does have some focal discomfort especially over the right hip and right lateral thigh.  No deformity.  Strong intact distal pulses.  Clinical Course as of Jul 16 2326  Fri Jul 16, 2018  2138 Patient is currently resting comfortably, however on exam when ranging the right lower extremity she continues to endorse pain especially about the right proximal femur.  She has previously tolerated oxycodone with previous surgeries according to family, will give small dose of oxycodone as well as ibuprofen for which she is able to take without issue as well in the past.  Will obtain MRI of the right hip, and retest ambulation thereafter if no fracture.   [MQ]    Clinical Course User Index [MQ] Delman Kitten, MD   ----------------------------------------- 11:26 PM on 07/16/2018 -----------------------------------------  Ongoing care assigned to Dr. Mable Paris.  Family and patient updated on plan, at this point will obtain MRI of the right hip and if no fracture trial ambulation with pain control.  Patient still and able to ambulate safely, given she lives at home by herself would consider social work  and physical therapy consultations pending further disposition planning.  ____________________________________________   FINAL CLINICAL IMPRESSION(S) / ED DIAGNOSES  Final diagnoses:  Acute leg pain, right  Fall, initial encounter        Note:  This document was prepared using Systems analyst and may include unintentional dictation errors       Delman Kitten, MD 07/16/18 2327

## 2018-07-16 NOTE — ED Notes (Signed)
Pt waiting on MRI.

## 2018-07-16 NOTE — ED Notes (Signed)
Family on phone answering MRI screening questions.

## 2018-07-17 ENCOUNTER — Emergency Department: Payer: PPO

## 2018-07-17 DIAGNOSIS — I639 Cerebral infarction, unspecified: Secondary | ICD-10-CM | POA: Diagnosis not present

## 2018-07-17 DIAGNOSIS — E119 Type 2 diabetes mellitus without complications: Secondary | ICD-10-CM | POA: Diagnosis not present

## 2018-07-17 DIAGNOSIS — F330 Major depressive disorder, recurrent, mild: Secondary | ICD-10-CM | POA: Diagnosis not present

## 2018-07-17 DIAGNOSIS — I739 Peripheral vascular disease, unspecified: Secondary | ICD-10-CM | POA: Diagnosis not present

## 2018-07-17 DIAGNOSIS — M1711 Unilateral primary osteoarthritis, right knee: Secondary | ICD-10-CM | POA: Diagnosis not present

## 2018-07-17 DIAGNOSIS — I10 Essential (primary) hypertension: Secondary | ICD-10-CM | POA: Diagnosis not present

## 2018-07-17 DIAGNOSIS — S8991XA Unspecified injury of right lower leg, initial encounter: Secondary | ICD-10-CM | POA: Diagnosis not present

## 2018-07-17 DIAGNOSIS — Z9181 History of falling: Secondary | ICD-10-CM | POA: Diagnosis not present

## 2018-07-17 DIAGNOSIS — M6281 Muscle weakness (generalized): Secondary | ICD-10-CM | POA: Diagnosis not present

## 2018-07-17 DIAGNOSIS — M25561 Pain in right knee: Secondary | ICD-10-CM | POA: Diagnosis not present

## 2018-07-17 DIAGNOSIS — M79604 Pain in right leg: Secondary | ICD-10-CM | POA: Diagnosis not present

## 2018-07-17 DIAGNOSIS — E038 Other specified hypothyroidism: Secondary | ICD-10-CM | POA: Diagnosis not present

## 2018-07-17 DIAGNOSIS — M25551 Pain in right hip: Secondary | ICD-10-CM | POA: Diagnosis not present

## 2018-07-17 DIAGNOSIS — F339 Major depressive disorder, recurrent, unspecified: Secondary | ICD-10-CM | POA: Diagnosis not present

## 2018-07-17 DIAGNOSIS — R41841 Cognitive communication deficit: Secondary | ICD-10-CM | POA: Diagnosis not present

## 2018-07-17 DIAGNOSIS — M25461 Effusion, right knee: Secondary | ICD-10-CM | POA: Diagnosis not present

## 2018-07-17 DIAGNOSIS — R6889 Other general symptoms and signs: Secondary | ICD-10-CM | POA: Diagnosis not present

## 2018-07-17 DIAGNOSIS — M79651 Pain in right thigh: Secondary | ICD-10-CM | POA: Diagnosis not present

## 2018-07-17 DIAGNOSIS — S79911A Unspecified injury of right hip, initial encounter: Secondary | ICD-10-CM | POA: Diagnosis not present

## 2018-07-17 LAB — GLUCOSE, CAPILLARY
GLUCOSE-CAPILLARY: 97 mg/dL (ref 70–99)
GLUCOSE-CAPILLARY: 103 mg/dL — AB (ref 70–99)

## 2018-07-17 MED ORDER — LISINOPRIL 10 MG PO TABS
40.0000 mg | ORAL_TABLET | Freq: Every day | ORAL | Status: DC
  Administered 2018-07-17: 40 mg via ORAL
  Filled 2018-07-17: qty 4

## 2018-07-17 NOTE — ED Notes (Signed)
Pt returned from MRI °

## 2018-07-17 NOTE — ED Notes (Addendum)
PT working with pt. Family at bedside.

## 2018-07-17 NOTE — Progress Notes (Signed)
LCSW called HTA spoke to [facility] she is unable to authorize until PT report is in  George he will speak to family to discuss in home supports- Patient has walker and bedside potty and cane.  Called Weekend Pt [place] and there was no answer and no answer with  even team lead not answering  sent text to weekend PT  [employer] LCSW [phone]

## 2018-07-17 NOTE — Evaluation (Signed)
Physical Therapy Evaluation Patient Details Name: Alice Weaver MRN: [id number] DOB: [birth year] Today's Date: 07/17/2018   History of Present Illness  Pt is a 77 y.o. female presenting to hospital 07/16/18 s/p 2 falls in 2 days and c/o R knee and R hip pain.  DG pelvis and R femur, MRI R hip, and CT R knee negative for acute abnormalities.  Imaging R knee did note advanced OA worst lateral compartment; small joint effusion R knee; and tendinosis or strain hamstrings at origin (without tear).  PMH includes DM, htn, PVD, skin CA, TIA, dysrhythmia, hand surgery, wrist surgery, ORIF fibula fx.  Clinical Impression  Prior to hospital admission, pt was ambulatory with [facility]; h/o falls.  Pt lives alone in 1 level home with 2 steps to enter; family reports limited ability to assist pt.  Currently pt is min to mod assist with bed mobility; min assist with transfers; and CGA to min assist to ambulate 20 feet with youth sized RW (antalgic gait d/t R knee pain with vc's required for safe walker use).  Pt's R knee AAROM flexion 80 degrees (limited d/t pain).  Pain 5/10 R knee during session (pt reporting aching and throbbing sensation).  Pt requiring increased time and cueing for activities and for safe walker use.  Pt's BP 183/97 beginning of session resting in bed; 169/114 after standing activities, and 175/103 after walking (nurse notified).  Pt would benefit from skilled PT to address noted impairments and functional limitations (see below for any additional details).  Upon hospital discharge, recommend pt discharge to [place].    Follow Up Recommendations SNF    Equipment Recommendations  Rolling walker with 5" wheels(youth sized RW)    Recommendations for Other Services       Precautions / Restrictions Precautions Precautions: Fall Restrictions Weight Bearing Restrictions: No      Mobility  Bed Mobility Overal bed mobility: Needs Assistance Bed Mobility: Supine to Sit;Sit to Supine     Supine  to sit: Min assist;HOB elevated Sit to supine: Mod assist;HOB elevated   General bed mobility comments: assist for R LE semi-supine to sit and assist for B LE's sit to supine; vc's for technique; increased time to perform  Transfers Overall transfer level: Needs assistance Equipment used: Rolling walker (2 wheeled) Transfers: Sit to/from Stand Sit to Stand: Min assist         General transfer comment: x2 trials; vc's for correct UE/LE positioning; assist to initiate stand  Ambulation/Gait Ambulation/Gait assistance: Min guard;Min assist Gait Distance (Feet): 20 Feet Assistive device: Rolling walker (2 wheeled)(youth sized)   Gait velocity: decreased   General Gait Details: antalgic; decreased stance time/WB'ing R LE d/t R knee pain; vc's to stay closer to RW and increase UE support through RW to decrease antalgic gait  Stairs            Wheelchair Mobility    Modified Rankin (Stroke Patients Only)       Balance Overall balance assessment: Needs assistance Sitting-balance support: No upper extremity supported;Feet supported Sitting balance-Leahy Scale: Good Sitting balance - Comments: steady sitting reaching within BOS   Standing balance support: Bilateral upper extremity supported Standing balance-Leahy Scale: Poor Standing balance comment: requires B UE support on RW for static standing balance                             Pertinent Vitals/Pain Pain Assessment: 0-10 Pain Score: 5  Pain Location: R knee  Pain Descriptors / Indicators: Aching;Throbbing Pain Intervention(s): Limited activity within patient's tolerance;Monitored during session;Repositioned;Other (comment)(RN notified regarding pt's pain)  HR and O2 sats on room air WFL during session.    Home Living Family/patient expects to be discharged to:: Private residence Living Arrangements: Alone Available Help at Discharge: Family Type of Home: House Home Access: Stairs to enter    [employer] of Steps: 2 steps with [place]: One level Home Equipment: [place] - single point;Walker - standard;Walker - 4 wheels;Shower seat;Bedside commode      Prior Function Level of Independence: Needs assistance   Gait / Transfers Assistance Needed: Ambulates with SPC most of the time (family encourages pt to walk with SW at times d/t balance concerns)  ADL's / Homemaking Assistance Needed: Family assists with meals or pt eats out; assist with laundry as needed  Comments: Prior to last 2 falls, pt has had at least 6 falls in last 6 months.     Hand Dominance        Extremity/Trunk Assessment   Upper Extremity Assessment Upper Extremity Assessment: Generalized weakness(good B hand grip strength; 4/5 B elbow flexion/extension and B shoulder flexion)    Lower Extremity Assessment Lower Extremity Assessment: RLE deficits/detail;LLE deficits/detail RLE Deficits / Details: unable to perform R LE SLR without assist; hip flexion at least 3/5 AROM in sitting; limited knee flexion/extension AROM d/t R knee pain; R knee flexion AAROM to 80 degrees (limited d/t R knee pain) RLE: Unable to fully assess due to pain LLE Deficits / Details: able to perform L LE SLR independently; 4/5 hip flexion, knee flexion/extension, and DF       Communication   Communication: No difficulties  Cognition Arousal/Alertness: Awake/alert Behavior During Therapy: WFL for tasks assessed/performed Overall Cognitive Status: History of cognitive impairments - at baseline(Oriented to person, place, and situation.  Increased time to report date.)                                 General Comments: Increased time to respond to questions (and did not always answer questions being asked).      General Comments General comments (skin integrity, edema, etc.): R knee swelling noted.  Nursing cleared pt for participation in physical therapy.  Pt agreeable to PT session.  Pt's son  and daughter present during session.    Exercises  Transfer and gait training; walker use   Assessment/Plan    PT Assessment Patient needs continued PT services  PT Problem List Decreased strength;Decreased range of motion;Decreased activity tolerance;Decreased balance;Decreased mobility;Decreased knowledge of use of DME;Decreased knowledge of precautions;Pain       PT Treatment Interventions DME instruction;Gait training;Stair training;Functional mobility training;Therapeutic activities;Therapeutic exercise;Balance training;Patient/family education    PT Goals (Current goals can be found in the Care Plan section)  Acute Rehab PT Goals Patient Stated Goal: to improve mobility and balance PT Goal Formulation: With patient/family Time For Goal Achievement: 07/31/18 Potential to Achieve Goals: Good    Frequency Min 2X/week   Barriers to discharge Decreased caregiver support      Co-evaluation               AM-PAC PT "6 Clicks" Daily Activity  Outcome Measure Difficulty turning over in bed (including adjusting bedclothes, sheets and blankets)?: Unable Difficulty moving from lying on back to sitting on the side of the bed? : Unable Difficulty sitting down on and standing up from a chair  with arms (e.g., wheelchair, bedside commode, etc,.)?: Unable Help needed moving to and from a bed to chair (including a wheelchair)?: A Little Help needed walking in hospital room?: A Little Help needed climbing 3-5 steps with a railing? : A Lot 6 Click Score: 11    End of Session Equipment Utilized During Treatment: Gait belt Activity Tolerance: Patient limited by pain Patient left: in bed;with call bell/phone within reach;with bed alarm set;with SCD's reapplied Nurse Communication: Mobility status;Precautions;Other (comment)(Pt's pain and BP during session) PT Visit Diagnosis: Other abnormalities of gait and mobility (R26.89);Muscle weakness (generalized) (M62.81);History of falling  (Z91.81);Repeated falls (R29.6);Difficulty in walking, not elsewhere classified (R26.2);Pain Pain - Right/Left: Right Pain - part of body: Knee    Time: 1108-1206 PT Time Calculation (min) (ACUTE ONLY): 58 min   Charges:   PT Evaluation $PT Eval Low Complexity: 1 Low PT Treatments $Therapeutic Exercise: 8-22 mins $Therapeutic Activity: 8-22 mins       Leitha Bleak, PT 07/17/18, 1:07 PM [phone]

## 2018-07-17 NOTE — ED Notes (Signed)
Pt sleeping. 

## 2018-07-17 NOTE — NC FL2 (Signed)
[place] LEVEL OF CARE SCREENING TOOL     IDENTIFICATION  Patient Name: Alice Weaver Birthdate: 1941/06/28 Sex: female Admission Date (Current Location): 07/16/2018  [place] and [place] Number:  Engineering geologist and Address:  [facility], [address] [place], [place] [address]      Provider Number: [id number]  Attending Physician Name and Address:  No att. providers found  Relative Name and Phone Number:   [name] son Evart [phone]    Current Level of Care: Hospital Recommended Level of Care: [place] Prior Approval Number:    Date Approved/Denied:   PASRR Number: [id number]  Discharge Plan: SNF    Current Diagnoses: There are no active problems to display for this patient.   Orientation RESPIRATION BLADDER Height & Weight     Self, Place, Situation  Normal Incontinent Weight: 170 lb (77.1 kg) Height:  5\' 1"  (154.9 cm)  BEHAVIORAL SYMPTOMS/MOOD NEUROLOGICAL BOWEL NUTRITION STATUS      Incontinent (S) Diet(Diabetic)  AMBULATORY STATUS COMMUNICATION OF NEEDS Skin   Extensive Assist Verbally Normal                       Personal Care Assistance Level of Assistance  Bathing, Feeding, Dressing, Total care Bathing Assistance: Limited assistance Feeding assistance: Independent Dressing Assistance: Limited assistance Total Care Assistance: Limited assistance   Functional Limitations Info  Sight, Hearing, Speech Sight Info: Adequate Hearing Info: Adequate Speech Info: Adequate    SPECIAL CARE FACTORS FREQUENCY  PT (By licensed PT), OT (By licensed OT)     PT Frequency: x5 OT Frequency: x5            Contractures Contractures Info: Not present    Additional Factors Info  Allergies   Allergies Info: Adhesive Tape, Boniva Ibandronic Acid, Meloxicam, Neosporin Neomycin-bacitracin Zn-polymyx, Tramadol           Current Medications (07/17/2018):   This is the current hospital active medication list Current Facility-Administered Medications  Medication Dose Route Frequency Provider Last Rate Last Dose  . levothyroxine (SYNTHROID, LEVOTHROID) tablet 150 mcg  150 mcg Oral QAC breakfast Delman Kitten, MD      . lisinopril (PRINIVIL,ZESTRIL) tablet 40 mg  40 mg Oral Daily Delman Kitten, MD      . metFORMIN (GLUCOPHAGE) tablet 500 mg  500 mg Oral BID WC Delman Kitten, MD       Current Outpatient Medications  Medication Sig Dispense Refill  . amitriptyline (ELAVIL) 50 MG tablet Take 50 mg by mouth at bedtime.    Marland Kitchen aspirin 81 MG tablet Take 81 mg by mouth 3 (three) times a week.    . Calcium Carbonate-Vitamin D (CALCIUM + D PO) Take by mouth.    . ferrous sulfate 325 (65 FE) MG tablet Take 325 mg by mouth daily with breakfast.    . glimepiride (AMARYL) 2 MG tablet Take 2 mg by mouth daily with breakfast.    . levothyroxine (SYNTHROID, LEVOTHROID) 150 MCG tablet Take 150 mcg by mouth daily before breakfast.    . lisinopril (PRINIVIL,ZESTRIL) 40 MG tablet Take 40 mg by mouth daily. AM    . metFORMIN (GLUCOPHAGE) 500 MG tablet Take by mouth 2 (two) times daily with a meal.    . multivitamin-iron-minerals-folic acid (CENTRUM) chewable tablet Chew 1 tablet by mouth daily.    . pravastatin (PRAVACHOL) 20 MG tablet Take 20 mg by mouth daily.     Marland Kitchen topiramate (  TOPAMAX) 25 MG tablet Take 25 mg by mouth daily.    Marland Kitchen triamterene-hydrochlorothiazide (DYAZIDE) 37.5-25 MG per capsule Take 0.5 capsules by mouth daily. AM        Discharge Medications: Please see discharge summary for a list of discharge medications.  Relevant Imaging Results:  Relevant Lab Results:   Additional Information SSN [id number]  Joana Reamer, [place]

## 2018-07-17 NOTE — Progress Notes (Signed)
Called [facility] and spoke to [facility] and advised her PT has recommended SNF for this patient and we require an Auth number. PT note is not in as of yet. Patient has been accepted by [facility] as per Claiborne Billings.  [employer] LCSW [phone]

## 2018-07-17 NOTE — ED Notes (Signed)
Pt drinking diet soda and eating meal tray.

## 2018-07-17 NOTE — Progress Notes (Signed)
PT Cancellation Note  Patient Details Name: Alice Weaver MRN: [id number] DOB: 20-May-1941   Cancelled Treatment:    Reason Eval/Treat Not Completed: Other (comment).  PT consult received.  Chart reviewed.  Pt currently not in room (per chart orders for CT R knee).  Will re-attempt PT evaluation as appropriate after imaging in complete and results are known (SW, CM, and nurse notified).  Leitha Bleak, PT 07/17/18, 9:51 AM [phone]

## 2018-07-17 NOTE — ED Notes (Signed)
Pt cleansed of incontinent urine in bed. Pt does not appear to understand how to use call bell, tutorial provided to pt again.

## 2018-07-17 NOTE — ED Notes (Signed)
Pt placed in hospital bed by rachel and zach. Call bell at side.

## 2018-07-17 NOTE — ED Notes (Signed)
Pt relaxing and eating applesauce. Denies pain or any other needs.

## 2018-07-17 NOTE — ED Notes (Addendum)
Pt displays expressive aphasia. Pt will not relax arm when BP cuff goes off.

## 2018-07-17 NOTE — ED Notes (Signed)
Pulse rate inaccurate d/t BP cuff inflation.

## 2018-07-17 NOTE — Clinical Social Work Note (Signed)
Clinical Social Work Assessment  Patient Details  Name: RAINEY RODGER MRN: [id number] Date of Birth: 12-Jul-1941  Date of referral:  07/17/18               Reason for consult:  Facility Placement                Permission sought to share information with:  Family Supports Permission granted to share information::  Yes, Verbal Permission Granted  Name::     yulia ulrich [phone] son             daughter Bernice::  All facilities  Relationship::     Contact Information:     Housing/Transportation Living arrangements for the past 2 months:  [place] of Information:  Patient Patient Interpreter Needed:  None Criminal Activity/Legal Involvement Pertinent to Current Situation/Hospitalization:  No - Comment as needed Significant Relationships:  Adult Children Lives with:  Adult Children Do you feel safe going back to the place where you live?  Yes Need for family participation in patient care:  Yes (Comment)  Care giving concerns: Called daughter -No answer/ Called son ( mail box full unable to leave message) Patient kids in at 8:30 am to meet with SW   Social Worker assessment / plan: LCSW introduced myself to patient and her children, We are awaiting PT consult patient is unable to walk and very weak to pull herself up. Patient is diabetic and has 2 knee issues. Patient lives alone and has had several falls in the last week. PT consult and care management consults are pending. LCSW will put information into HUB to facilities and obtain authorization for SNF [facility]  Called for authorization at [facility] and they cant act until PT comes in. Called PT and they are aware of order.   Employment status:  Retired Forensic scientist:    PT Recommendations:  Not assessed at this time, Inpatient Rehab Consult Information / Referral to community resources:  [place]  Patient/Family's Response to care:  TBD- From Dr Jacqualine Code- Family  reports they cant take care of her-  Patient/Family's Understanding of and Emotional Response to Diagnosis, Current Treatment, and Prognosis: Patient reports feeling  Emotional Assessment Appearance:  Appears stated age Attitude/Demeanor/Rapport:  Inconsistent Affect (typically observed):  Unable to Assess Orientation:  Oriented to Self, Oriented to Place, Oriented to Situation Alcohol / Substance use:  Not Applicable Psych involvement (Current and /or in the community):  No (Comment)  Discharge Needs  Concerns to be addressed:  Care Coordination Readmission within the last 30 days:  No Current discharge risk:  Lack of support system Barriers to Discharge:  [place], [place] 07/17/2018, 8:12 AM

## 2018-07-17 NOTE — ED Notes (Signed)
Report to georgie, rn.
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
## 2018-07-17 NOTE — ED Notes (Signed)
Pt and family updated and agreeable to transporting pt to [facility]. All paperwork will be sent with transport.

## 2018-07-17 NOTE — Progress Notes (Signed)
LCSW called and spoke to [facility] at [facility] [phone]- She is still working on it. LCSW explained this patient needs to be expedited. She reports she will try her best to get auth by 4pm.   LCSW was provided a room number 1A- From [facility]- Call report Number is [phone]  Patient to be transported by EMS  LCSW consulted ED RN and EDP that we are still waiting for auth.   [employer] LCSW [phone]

## 2018-07-17 NOTE — Clinical Social Work Note (Addendum)
CSW contacted Renee at [facility] to advise that this CSW is taking case and to update call-back number. Joseph Art is currently working on the [place] and will call the CSW when finished. CSW is following.  UPDATE: The CSW received the authorization for this patient to discharge to [facility] from [facility]. Auth # [id number]. CSW has updated the team and the facility. CSW is signing off. Please consult should additional needs arise.  Santiago Bumpers, MSW, Latanya Presser [phone]

## 2018-07-17 NOTE — Discharge Instructions (Addendum)
Fortunately today your lab work, your x-rays, and your MRI were reassuring.  Please follow-up with your primary care physician this coming Monday for recheck and return to the emergency department sooner for any concerns.  Results for orders placed or performed during the hospital encounter of 07/16/18  CBC  Result Value Ref Range   WBC 9.8 4.0 - 10.5 K/uL   RBC 3.90 3.87 - 5.11 MIL/uL   Hemoglobin 12.1 12.0 - 15.0 g/dL   HCT 36.5 36.0 - 46.0 %   MCV 93.6 80.0 - 100.0 fL   MCH 31.0 26.0 - 34.0 pg   MCHC 33.2 30.0 - 36.0 g/dL   RDW 13.3 11.5 - 15.5 %   Platelets 366 150 - 400 K/uL   nRBC 0.0 0.0 - 0.2 %  Basic metabolic panel  Result Value Ref Range   Sodium 141 135 - 145 mmol/L   Potassium 3.0 (L) 3.5 - 5.1 mmol/L   Chloride 105 98 - 111 mmol/L   CO2 27 22 - 32 mmol/L   Glucose, Bld 161 (H) 70 - 99 mg/dL   BUN 26 (H) 8 - 23 mg/dL   Creatinine, Ser 1.15 (H) 0.44 - 1.00 mg/dL   Calcium 8.9 8.9 - 10.3 mg/dL   GFR calc non Af Amer 44 (L) >60 mL/min   GFR calc Af Amer 51 (L) >60 mL/min   Anion gap 9 5 - 15  Urinalysis, Complete w Microscopic  Result Value Ref Range   Color, Urine YELLOW (A) YELLOW   APPearance CLEAR (A) CLEAR   Specific Gravity, Urine 1.011 1.005 - 1.030   pH 6.0 5.0 - 8.0   Glucose, UA NEGATIVE NEGATIVE mg/dL   Hgb urine dipstick NEGATIVE NEGATIVE   Bilirubin Urine NEGATIVE NEGATIVE   Ketones, ur NEGATIVE NEGATIVE mg/dL   Protein, ur NEGATIVE NEGATIVE mg/dL   Nitrite NEGATIVE NEGATIVE   Leukocytes, UA NEGATIVE NEGATIVE   RBC / HPF 0-5 0 - 5 RBC/hpf   WBC, UA 0-5 0 - 5 WBC/hpf   Bacteria, UA NONE SEEN NONE SEEN   Squamous Epithelial / LPF 0-5 0 - 5   Dg Pelvis 1-2 Views  Result Date: 07/16/2018 CLINICAL DATA:  Fall with right leg pain EXAM: PELVIS - 1-2 VIEW COMPARISON:  PET-CT 12/04/2016 FINDINGS: There is no evidence of pelvic fracture or diastasis. No pelvic bone lesions are seen. Partially seen lumbar spine degeneration with scoliosis. IMPRESSION: No  acute finding. Electronically Signed   By: Monte Fantasia M.D.   On: 07/16/2018 20:24   Dg Tibia/fibula Right  Result Date: 07/16/2018 CLINICAL DATA:  Right leg pain due to a fall today. Initial encounter. EXAM: RIGHT TIBIA AND FIBULA - 2 VIEW COMPARISON:  Plain films right knee 01/15/2018. FINDINGS: There is no acute bony or joint abnormality. The patient has advanced osteoarthritis about the right knee which shows some worsening since the prior exam. Small right knee joint effusion is noted. Soft tissues unremarkable. IMPRESSION: No acute abnormality. Advanced osteoarthritis right knee with an associated small effusion. Electronically Signed   By: Inge Rise M.D.   On: 07/16/2018 20:25   Dg Femur Min 2 Views Right  Result Date: 07/16/2018 CLINICAL DATA:  Right leg pain due to a fall this morning. Initial encounter. EXAM: RIGHT FEMUR 2 VIEWS COMPARISON:  None. FINDINGS: There is no acute bony or joint abnormality. Mild degenerative change is present about the right hip. The patient has advanced osteoarthritis about the right knee. Soft tissues are unremarkable. IMPRESSION: No  acute abnormality. Right knee much worse than right hip osteoarthritis. Electronically Signed   By: Inge Rise M.D.   On: 07/16/2018 20:24

## 2018-07-20 ENCOUNTER — Other Ambulatory Visit: Payer: Self-pay | Admitting: *Deleted

## 2018-07-20 DIAGNOSIS — F330 Major depressive disorder, recurrent, mild: Secondary | ICD-10-CM | POA: Diagnosis not present

## 2018-07-20 DIAGNOSIS — Z9181 History of falling: Secondary | ICD-10-CM | POA: Diagnosis not present

## 2018-07-20 DIAGNOSIS — M1711 Unilateral primary osteoarthritis, right knee: Secondary | ICD-10-CM | POA: Diagnosis not present

## 2018-07-20 DIAGNOSIS — M25561 Pain in right knee: Secondary | ICD-10-CM | POA: Diagnosis not present

## 2018-07-20 NOTE — Patient Outreach (Signed)
[facility] Care Management  07/20/2018  Alice Weaver 1941-05-21 [id number]   Attended interdisciplinary discharge team meeting at [place] center with [place] team member Marlowe Kays.  PT stated patient's PLOF was independent and she lived alone for several years, at this point patient has had some confusion and safety/impulses which at this point would prevent a safe discharge to home.  Discharge planner states patient/family meeting scheduled for this week, noted patient just arrived at the facility this past Saturday(07/17/18).  Gave discharge planner a [facility] pamphlet to give to family at d/c planing meeting.  PT states patient is receiving 6xper week PT and is going to have a speech eval for confusion and safety awareness issues.   Met with patient and daughter Alice Weaver at the bedside with [facility] team member Marlowe Kays.  Patient resting comfortably in bed, talking about how she had lived alone since 1997.  Daughter expressed concern about patient returning home and stated her mother had just recently received the official diagnosis of dementia.  Daughter expressed they were not sure what the discharge plan was at this point.  [place] [facility] services and gave daughter [facility] packet with contact card for questions.  Also gave daughter the [email or web address].org resource to assist her in navigating her mom's new diagnosis.  Daughter stated she had reached out to [employer] for resources also.  Daughter stated she would look over [facility] information.  Daughter Alice Weaver also stated her brother was involved in the decision making for her mother's care.   Plan to see patient at next facility visit.   Rutherford Limerick RN, BSN [place] Acute Care Coordinator [phone]) Business Mobile [phone]) Toll free office

## 2018-07-27 ENCOUNTER — Other Ambulatory Visit: Payer: Self-pay | Admitting: *Deleted

## 2018-07-27 NOTE — Patient Outreach (Signed)
[facility] [facility]) Care Management  07/27/2018  Alice Weaver 22-Aug-1941 [id number]   Attended interdisciplinary team member discharge planning meeting with [place] Team member Marlowe Kays.  PT stating patient does well in therapy and follows directions, but needs consistent cueing to complete tasks.  Nursing states daughter requesting a UA/UC because patient is more confused. Awaiting results.  Discharge planner states daughter wants patient to go to ALF.   Went to patient's room to speak with patient and daughter. Daughter concerned patient's confusion has worsened since admission to SNF.  Talked with daughter about patient's discharge plan.  Daughter states she does not have a plan, but feels her mom is not safe to live alone any more.  Daughter requested to speak with [facility] social worker to discuss options.  Patient noted to be pleasantly confused and unable to recall how she ended up being at the facility.   Referral sent for [facility] LCSW to reach out to daughter to assist with discharge planning needs.  Collaborated with Chrystal Land LCSW to make her aware of daughter's questions.   Rutherford Limerick RN, BSN [place] Acute Care Coordinator [phone]) Business Mobile [phone]) Toll free office

## 2018-07-29 ENCOUNTER — Other Ambulatory Visit: Payer: Self-pay | Admitting: *Deleted

## 2018-07-29 NOTE — Patient Outreach (Signed)
[place] [facility]) Care Management  07/29/2018  Alice Weaver 18-Sep-1941 [id number]   Phone call to patient to ask for verbal permission to speak to her daughter regarding her care. Patient provided this Education officer, museum with verbal permission.   Phone call to patient's daughter who verbalized feeling overwhelmed and frustrated regarding care received while in rehab at [facility] healthcare. Per patient's daughter, she is concerned about patient's care and basic  needs being met. She has confirmed that she has verbalized her concerns to the Chief Financial Officer of Nursing. She also met with the treatment tem last week and ALF was recommended for patient, however patient's daughter continues to report confusion regarding  patient's discharge plan and what should ultimately be done. Per patient's daughter the treatment team has now refused to provide her with any information regarding patient until she can provide documentation that she is patient's POA.   Per patient's daughter, patient does not qualify for Medicaid and they cannot afford to pay out of pocket.Patient does not have long term care insurance.  Patient resides in her own home and cannot live with her or her brother because there is not enough space for her. Several options discussed, such as hiring a personal care aid to assist patient with home care needs. It was also suggested that she clarify with the Department of Social Services the type of Medicaid that she does not qualify for . The difference between community Medicaid and special assistance Medicaid for placement  purposes discussed as the eligibility criteria is different. Based on the criteria, her eligibility for ALF may change. It was stronlgy suggested that she begin discussing a plan of care to prepare for patient's discharge as it takes 45 days for a Medicaid application to be approved.  This Education officer, museum further suggested that she contact her local ombudsman  regarding her care concerns and the difficulty in getting information regarding patient's status. Patient's daughter states that she does have the number and will plan to call to advocate on patient's behalf. [facility] care management social worker role discussed. Patient's daughter provided with this social worker's contact number to assist with patient's care needs post discharge from the SNF.   Plan: This Education officer, museum will follow up with patient within 2 weeks.   Sheralyn Boatman [facility] Care Management [phone]

## 2018-08-06 ENCOUNTER — Ambulatory Visit: Payer: Self-pay | Admitting: *Deleted

## 2018-08-06 ENCOUNTER — Other Ambulatory Visit: Payer: Self-pay | Admitting: *Deleted

## 2018-08-06 NOTE — Patient Outreach (Signed)
[place] [facility]) Care Management  08/06/2018  Alice Weaver 01-09-1941 [id number]   Phone call to patient's daughter to discuss patient's status  while in rehab. Per patient's daughter they are planning to discharge patient on 08/07/18. Patient's daughter has now filed an appeal and is waiting on the determination,. Patient's daughter remains confused regarding next steps for patient post discharge from the SNF and would like specific recommendations regarding level of care needs. She is not trusting recommendations from [employer]. This Education officer, museum discussed hiring a private duty aid to assist patient at home until a long term care decision is made. This Education officer, museum also suggested that she speak with a representative at the [place]  Regarding long term Medicaid for patient and the spend down process.  Plan: This Education officer, museum will follow up with patient's daughter within 1 week.   Sheralyn Boatman [facility] Care Management [phone]

## 2018-08-10 ENCOUNTER — Other Ambulatory Visit: Payer: Self-pay | Admitting: *Deleted

## 2018-08-10 NOTE — Patient Outreach (Signed)
[place] [facility]) Care Management  08/10/2018  MARLIN BRYS Jul 02, 1941 [id number]  Patient noted to discharge home from [facility] according to [facility] report on 08/09/18.  Patient already engaged by [facility] LCSW Chrystal.  Referral sent for patient to be engaged by[facility] Market researcher for transition of care.   Rutherford Limerick RN, BSN [place] Acute Care Coordinator [phone]) Business Mobile [phone]) Toll free office

## 2018-08-11 ENCOUNTER — Other Ambulatory Visit: Payer: Self-pay | Admitting: *Deleted

## 2018-08-11 ENCOUNTER — Encounter: Payer: Self-pay | Admitting: *Deleted

## 2018-08-11 NOTE — Patient Outreach (Signed)
[place] [facility]) Care Management [place], Transition of Care day # 1 Post- SNF discharge day # 2  08/11/2018  Alice Weaver Jan 14, 1941 [id number]  Successful telephone outreach to Alice Weaver, daughter/ caregiver of Alice Weaver, 77 y/o female referred to [place] for transition of care after recent discharge home to self-care from SNF visit for rehabilitation; patient was placed in short-term SNF for rehabilitation post ED visit for fall with injury on July 16, 2018.  Patient provided verbal request/ consent for [place] team to communicate with her daughter while patient was at [facility] for rehabilitation.  Patient has history including, but not limited to, frequent falls with injury; HLD/ HTN; DM; hypothyroidism; osteoporosis; and GERD.  [place] services were discussed with patient's caregiver/ daughter, and she provides verbal consent for [place] RN CM involvement in patient's care post- SNF discharge.  Today, caregiver reports that patient has been doing "so-so" since her discharge home from SNF on Monday 08/09/18; states "she did okay the first day, but today was rough on her." Caregiver reported that patient was unable to get out of bed this morning and that she (caregiver) had to go and "physically get her out of bed."  Caregiver denies that patient has had new/ recent falls post- SNF discharge.  Caregiver further reports:  Medications: -- Has all medicationsand takes as prescribed;denies questions about current medications.  -- family manages medications using weekly pill planner box -- states have recently requested blister packaging from patient's outpatient pharmacy -- denies issues with swallowing medications -- declines medication review today, as caregiver is not present at patient's home at time of call today -- need for prompt medication review discussed with caregiver today; encouraged caregiver  to have medications reviewed by PCP promptly  Home health [facility]) services: -- caregiver uncertain if [place] Weaver services were ordered post-SNF discharge; states that "there is something on the discharge papers, but no one said anything about it when she was released."  Caregiver verbalizes plan to contact SNF staff to clarify; encouraged her to do promptly and to contact me if she had questions/ concerns after reaching out to SNF staff. -- explained difference between [facility] and [facility] CM services; caregiver verbalizes understanding   Provider appointments: -- Noted patient does not have scheduled post -SNF discharge PCP appointment; caregiver states she plans to contact PCP tomorrow to schedule around her work schedule; discussed value of prompt PCP follow up post-discharge  Safety/ Mobility/ Falls: -- denies new/ recent falls post- SNF discharge, however reports ongoing frequent history of falls -- assistive devices: uses walker "all the time" -- general fall risks/ prevention education discussed with caregiver today-- reports that family has instated life-alert system for patient; caregiver admits to concern around general patient safety with patient being demented and living alone -- patient requires assistance with "most" ADL's   Social/ Community Resource needs: -- reports supportive family members that assist with care needs as indicated, however, family is limited around work schedules -- caregiver and her sibling live nearby patient and "tag-team" to check on patient throughout each day -- family provides transportation for patient to all provider appointments, errands, etc -- caregiver reports that she believes patient needs to be placed in long-term care facility; states patient does not currently have medicaid, and that patient "will refuse" long term placement , although family has been working with patient "to consider" -- acknowledged that she has spoken with [facility] CSW- encouraged caregiver to  maintain contact with [facility] CSW around possible community resource/ LTC facility placement  Advanced Directive (AD) Planning:   --reports does not currently have exisisting AD in place.  Basics of AD planning was discussed with caregiver today  -- caregiver reports that she understands patient's basic wishes around AD planning, and adds that she has encouraged patient to move forward in creating her own AD's  Caregiver denies further issues, concerns, or problems today.  I provided/ confirmed that she has my direct phone number, the main [facility] CM office phone number, and the [facility] CM 24-hour nurse advice phone number should issues arise prior to next scheduled [place] outreach.  Encouraged patient/ caregiver to contact me directly if needs, questions, issues, or concerns arise prior to next scheduled outreach; she agreed to do so.  Plan:  Patient will take medications as prescribed and will attend all scheduled provider appointments  Patient/ caregiver will promptly schedule post-SNF discharge office visit with PCP  Patient will promptly notify care providers for any new concerns/ issues/ problems that arise  Caregiver will contact SNF staff regarding status of home health services post- SNF discharge  Patient will use assistive devices for fall prevention  Caregiver will maintain communication with [facility] CSW aorund options for long term facility placement for patient  I will make patien't PCP aware of [place] RN CM involvement in patient's care-- will send barriers letter  [place] outreach for transition of care to continue with scheduled phone call next week  [facility] CM Care Plan Problem One     Most Recent Value  Care Plan Problem One  High risk for [facility] readmission related to/ as evidenced by recent SNF rehabilitation discharge for falls, in patient with dementia who lives alone and has limited family support  Role Documenting the Problem One  Care Management  Coordinator  Care Plan for Problem One  Active  [facility] Long Term Goal   Over the next 31 days, patient will not experience [facility] readmission, as evidenced by caregiver reporting and review of EMR during [facility] RN CCM outreach  [facility] Long Term Goal Start Date  08/11/18  Interventions for Problem One Long Term Goal  Discussed with caregiver patient's current clinical condition post- SNF discharge,  discussed overall patient level of care needs and current long term plan for patient,  [facility] Community CM TOC program initiated  [facility] CM Short Term Goal #1   Over the next 14 days, patient will schedule and attend post-SNF discharge PCP office visit, as evidenced by caregiver reporting and review of EMR/ provider collaboration as indicated during [facility] RN CCM outreach  [facility] CM Short Term Goal #1 Start Date  08/11/18  Interventions for Short Term Goal #1  Discussed with caregiver value of scheduling patient prompt PCP office visit post- SNF discharge,  discussed with caregiver general process to have patient placed in long term care facility,  encouraged caregiver to promptly schedule post- SNF PCP office visit     [facility] CM Care Plan Problem Two     Most Recent Value  Care Plan Problem Two  Level of care needs in high fall risk patient with dementia that lives alone, as evidenced by caregiver reporting  Role Documenting the Problem Two  Care Management [place] for Problem Two  Active  [facility] CM Short Term Goal #1   Over the next 30 days, patient's caregiver will maintain communication with [facility] CSW around options for placing patient in long-term care facility,  as evidenced by caregiver reporting and ongoing collaboration with [facility] CSW  [facility] CM Short Term Goal #1 Start Date  08/11/18  Interventions for Short Term Goal #2   Encouraged caregiver to maintain communication with [facility] CSW, currently active in patient's care     Oneta Rack, RN, BSN, [place] Care  Management  [phone]

## 2018-08-12 ENCOUNTER — Ambulatory Visit: Payer: Self-pay | Admitting: *Deleted

## 2018-08-13 ENCOUNTER — Ambulatory Visit: Payer: Self-pay | Admitting: *Deleted

## 2018-08-13 ENCOUNTER — Other Ambulatory Visit: Payer: Self-pay | Admitting: *Deleted

## 2018-08-13 NOTE — Patient Outreach (Signed)
[place] [facility]) Care Management  08/13/2018  LARETTA PYATT 09-Jun-1941 [id number]   Phone call to patient's daughter [name] to assess for community resource needs post discharge from rehab. There was no answer, voicemail message left for a return call.   Sheralyn Boatman [facility] Care Management [phone]

## 2018-08-16 ENCOUNTER — Other Ambulatory Visit: Payer: Self-pay | Admitting: *Deleted

## 2018-08-16 ENCOUNTER — Encounter: Payer: Self-pay | Admitting: *Deleted

## 2018-08-16 NOTE — Patient Outreach (Signed)
[place] [facility]) Care Management [place], Transition of Care day # 6 Post- SNF discharge day # 7 EMMI Red flag notification- general discharge/ sad/ hopeless Unsuccessful telephone outreach attempt # 1- engaged patient 08/16/2018  Alice Weaver December 22, 1940 [id number]  1:30 pm:  Unsuccessful telephone outreach to [name], daughter/ caregiver of Alice Weaver, 77 y/o female referred to [place] for transition of care after recent discharge home to self-care from SNF visit for rehabilitation; patient was placed in short-term SNF for rehabilitation post ED visit for fall with injury on July 16, 2018.  Patient provided verbal request/ consent for [place] team to communicate with her daughter while patient was at [facility] for rehabilitation.  Patient has history including, but not limited to, frequent falls with injury; HLD/ HTN; DM; hypothyroidism; osteoporosis; dementia; and GERD.  Received notification that patient had flagged EMMI- red for general discharge regarding feeling sad, hopeless, anxious.  HIPAA compliant voice mail message left for patient's caregiver, requesting return call back.  Plan:  Will re-attempt [place] telephone outreach later this week if I do not hear back from patient/ caregiver first.  Oneta Rack, RN, BSN, [place] Coordinator [facility] Care Management  [phone]

## 2018-08-18 ENCOUNTER — Other Ambulatory Visit: Payer: Self-pay | Admitting: *Deleted

## 2018-08-18 ENCOUNTER — Encounter: Payer: Self-pay | Admitting: *Deleted

## 2018-08-18 DIAGNOSIS — M79604 Pain in right leg: Secondary | ICD-10-CM | POA: Diagnosis not present

## 2018-08-18 NOTE — Patient Outreach (Signed)
[place] [facility]) Care Management [place], Transition of Care day # 8 Post- SNF discharge day # 9 Unsuccessful call attempt # 2- outreach letter sent today  08/18/2018  Alice Weaver 1941/09/06 [id number]  11:45 am: Unsuccessful telephone outreach to [name], daughter/ caregiver of Alice Weaver, 77 y/o female referred to [place] for transition of care after recent discharge home to self-care from SNF visit for rehabilitation; patient was placed in short-term SNF for rehabilitation post ED visit for fall with injury on July 16, 2018.  Patient provided verbal request/ consent for [place] team to communicate with her daughter while patient was at [facility] for rehabilitation.  Patient has history including, but not limited to, frequent falls with injury; HLD/ HTN; DM; hypothyroidism; osteoporosis; and GERD.  HIPAA compliant voice mail message left for patient's caregiver, requesting return call back.  Plan:  Will place [facility] Community CM unsuccessful patient outreach letter in mail requesting call back in writing  Will re-attempt [place] telephone outreach next week if I do not hear back from patient/ caregiver first.  Oneta Rack, RN, BSN, [place] Coordinator [facility] Care Management  [phone]

## 2018-08-19 ENCOUNTER — Other Ambulatory Visit: Payer: Self-pay | Admitting: *Deleted

## 2018-08-19 NOTE — Patient Outreach (Signed)
[place] [facility]) Care Management  08/19/2018  Alice Weaver [birth year] [id number]   Phone call to patient's daughter to assess for continued social work needs. Per patient's daughter, patient continues to live alone in her own home. Patient's son checks on her daily as patient's daughter's husband has just had surgery.Once he recovers, both patient's son and daughter will be available to assist with patient's care needs.  Per [name], patient plans to remain in her home for now unless her condition progresses. Patient's daughter verbalized having no additional community support needs at this time. This Education officer, museum will sign off at this time. This social worker encouraged patient's daughter to call if there are any questions or community resource needs.   Sheralyn Boatman [facility] Care Management [phone]

## 2018-08-23 ENCOUNTER — Other Ambulatory Visit: Payer: Self-pay | Admitting: *Deleted

## 2018-08-23 ENCOUNTER — Encounter: Payer: Self-pay | Admitting: *Deleted

## 2018-08-23 NOTE — Patient Outreach (Signed)
[place] [facility]) Care Management [place], Transition of Care day # 13 Post- SNF discharge day # 13 Unsuccessful outreach attempt # 3  08/23/2018  [name] [birth year] [id number]  3:10 pm: Unsuccessful telephone outreach to [name], daughter/ caregiver of Alice Weaver, 77 y/o female referred to [place] for transition of care after recent discharge home to self-care from SNF visit for rehabilitation; patient was placed in short-term SNF for rehabilitation post ED visit for fall with injury on July 16, 2018.  Patient provided verbal request/ consent for [place] team to communicate with her daughter while patient was at [facility] for rehabilitation.  Patient has history including, but not limited to, frequent falls with injury; HLD/ HTN; DM; hypothyroidism; osteoporosis; and GERD.   HIPAA compliant voice mail message left for patient's caregiver, requesting return call back.  Plan:  Verified [place] unsuccessful patient outreach letter placed in mail requesting call back in writing on August 18, 2018  Will await call back from patient's caregiver and proceed to case closure if no call back by end of this week  Oneta Rack, RN, BSN, [employer] Coordinator [facility] Care Management  [phone]

## 2018-08-27 ENCOUNTER — Other Ambulatory Visit: Payer: Self-pay | Admitting: *Deleted

## 2018-08-27 NOTE — Patient Outreach (Signed)
[place] [facility]) Care Management [place] Telephone Outreach Case Closure  08/27/2018  Alice Weaver 1941/07/19 [id number]  [place] CM Case Closure re:  Alice Weaver, 77 y/o female referred to [place] for transition of care after recent discharge home to self-care from SNF visit for rehabilitation; patient was placed in short-term SNF for rehabilitation post ED visit for fall with injury on July 16, 2018.   Three consecutive unsuccessful phone call attempts have been placed to patient's caregiver, without return call from patient/ caregiver within 10 business days    Plan:  Verified [place] unsuccessful patient outreach letter placed in mail requesting call back in writing on August 18, 2018  Will close case and make patient's PCP aware of same  Oneta Rack, RN, BSN, [place] Care Management  [phone]

## 2018-09-01 DIAGNOSIS — J45909 Unspecified asthma, uncomplicated: Secondary | ICD-10-CM | POA: Diagnosis not present

## 2018-09-01 DIAGNOSIS — Z885 Allergy status to narcotic agent status: Secondary | ICD-10-CM | POA: Diagnosis not present

## 2018-09-01 DIAGNOSIS — S5002XA Contusion of left elbow, initial encounter: Secondary | ICD-10-CM | POA: Diagnosis not present

## 2018-09-01 DIAGNOSIS — S8991XA Unspecified injury of right lower leg, initial encounter: Secondary | ICD-10-CM | POA: Diagnosis not present

## 2018-09-01 DIAGNOSIS — Z87891 Personal history of nicotine dependence: Secondary | ICD-10-CM | POA: Diagnosis not present

## 2018-09-01 DIAGNOSIS — I10 Essential (primary) hypertension: Secondary | ICD-10-CM | POA: Diagnosis not present

## 2018-09-01 DIAGNOSIS — M1711 Unilateral primary osteoarthritis, right knee: Secondary | ICD-10-CM | POA: Diagnosis not present

## 2018-09-01 DIAGNOSIS — Z7984 Long term (current) use of oral hypoglycemic drugs: Secondary | ICD-10-CM | POA: Diagnosis not present

## 2018-09-01 DIAGNOSIS — Z9049 Acquired absence of other specified parts of digestive tract: Secondary | ICD-10-CM | POA: Diagnosis not present

## 2018-09-01 DIAGNOSIS — Z96659 Presence of unspecified artificial knee joint: Secondary | ICD-10-CM | POA: Diagnosis not present

## 2018-09-01 DIAGNOSIS — Z85820 Personal history of malignant melanoma of skin: Secondary | ICD-10-CM | POA: Diagnosis not present

## 2018-09-01 DIAGNOSIS — Z7982 Long term (current) use of aspirin: Secondary | ICD-10-CM | POA: Diagnosis not present

## 2018-09-01 DIAGNOSIS — M4800 Spinal stenosis, site unspecified: Secondary | ICD-10-CM | POA: Diagnosis not present

## 2018-09-01 DIAGNOSIS — M79641 Pain in right hand: Secondary | ICD-10-CM | POA: Diagnosis not present

## 2018-09-01 DIAGNOSIS — E042 Nontoxic multinodular goiter: Secondary | ICD-10-CM | POA: Diagnosis not present

## 2018-09-01 DIAGNOSIS — E7800 Pure hypercholesterolemia, unspecified: Secondary | ICD-10-CM | POA: Diagnosis not present

## 2018-09-01 DIAGNOSIS — Z79899 Other long term (current) drug therapy: Secondary | ICD-10-CM | POA: Diagnosis not present

## 2018-09-01 DIAGNOSIS — M25561 Pain in right knee: Secondary | ICD-10-CM | POA: Diagnosis not present

## 2018-09-01 DIAGNOSIS — Z87440 Personal history of urinary (tract) infections: Secondary | ICD-10-CM | POA: Diagnosis not present

## 2018-09-01 DIAGNOSIS — E785 Hyperlipidemia, unspecified: Secondary | ICD-10-CM | POA: Diagnosis not present

## 2018-09-01 DIAGNOSIS — Z8262 Family history of osteoporosis: Secondary | ICD-10-CM | POA: Diagnosis not present

## 2018-09-01 DIAGNOSIS — M1990 Unspecified osteoarthritis, unspecified site: Secondary | ICD-10-CM | POA: Diagnosis not present

## 2018-09-01 DIAGNOSIS — M25562 Pain in left knee: Secondary | ICD-10-CM | POA: Diagnosis not present

## 2018-09-01 DIAGNOSIS — M25522 Pain in left elbow: Secondary | ICD-10-CM | POA: Diagnosis not present

## 2018-09-01 DIAGNOSIS — Z8673 Personal history of transient ischemic attack (TIA), and cerebral infarction without residual deficits: Secondary | ICD-10-CM | POA: Diagnosis not present

## 2018-09-01 DIAGNOSIS — Z792 Long term (current) use of antibiotics: Secondary | ICD-10-CM | POA: Diagnosis not present

## 2018-09-04 DIAGNOSIS — M1711 Unilateral primary osteoarthritis, right knee: Secondary | ICD-10-CM | POA: Diagnosis not present

## 2018-09-04 DIAGNOSIS — Z7984 Long term (current) use of oral hypoglycemic drugs: Secondary | ICD-10-CM | POA: Diagnosis not present

## 2018-09-04 DIAGNOSIS — I10 Essential (primary) hypertension: Secondary | ICD-10-CM | POA: Diagnosis not present

## 2018-09-04 DIAGNOSIS — E785 Hyperlipidemia, unspecified: Secondary | ICD-10-CM | POA: Diagnosis not present

## 2018-09-04 DIAGNOSIS — F339 Major depressive disorder, recurrent, unspecified: Secondary | ICD-10-CM | POA: Diagnosis not present

## 2018-09-04 DIAGNOSIS — Z7982 Long term (current) use of aspirin: Secondary | ICD-10-CM | POA: Diagnosis not present

## 2018-09-04 DIAGNOSIS — E1151 Type 2 diabetes mellitus with diabetic peripheral angiopathy without gangrene: Secondary | ICD-10-CM | POA: Diagnosis not present

## 2018-09-04 DIAGNOSIS — M4800 Spinal stenosis, site unspecified: Secondary | ICD-10-CM | POA: Diagnosis not present

## 2018-09-04 DIAGNOSIS — Z9181 History of falling: Secondary | ICD-10-CM | POA: Diagnosis not present

## 2018-09-04 DIAGNOSIS — Z96652 Presence of left artificial knee joint: Secondary | ICD-10-CM | POA: Diagnosis not present

## 2018-09-04 DIAGNOSIS — Z87891 Personal history of nicotine dependence: Secondary | ICD-10-CM | POA: Diagnosis not present

## 2018-09-04 DIAGNOSIS — J45909 Unspecified asthma, uncomplicated: Secondary | ICD-10-CM | POA: Diagnosis not present

## 2018-09-04 DIAGNOSIS — M79641 Pain in right hand: Secondary | ICD-10-CM | POA: Diagnosis not present

## 2018-09-04 DIAGNOSIS — D649 Anemia, unspecified: Secondary | ICD-10-CM | POA: Diagnosis not present

## 2018-09-04 DIAGNOSIS — E038 Other specified hypothyroidism: Secondary | ICD-10-CM | POA: Diagnosis not present

## 2018-09-04 DIAGNOSIS — Z87440 Personal history of urinary (tract) infections: Secondary | ICD-10-CM | POA: Diagnosis not present

## 2018-09-04 DIAGNOSIS — Z8673 Personal history of transient ischemic attack (TIA), and cerebral infarction without residual deficits: Secondary | ICD-10-CM | POA: Diagnosis not present

## 2018-10-06 DIAGNOSIS — F339 Major depressive disorder, recurrent, unspecified: Secondary | ICD-10-CM | POA: Diagnosis not present

## 2018-10-06 DIAGNOSIS — M1711 Unilateral primary osteoarthritis, right knee: Secondary | ICD-10-CM | POA: Diagnosis not present

## 2018-10-06 DIAGNOSIS — E785 Hyperlipidemia, unspecified: Secondary | ICD-10-CM | POA: Diagnosis not present

## 2018-10-06 DIAGNOSIS — I10 Essential (primary) hypertension: Secondary | ICD-10-CM | POA: Diagnosis not present

## 2018-10-06 DIAGNOSIS — E038 Other specified hypothyroidism: Secondary | ICD-10-CM | POA: Diagnosis not present

## 2018-10-06 DIAGNOSIS — J45909 Unspecified asthma, uncomplicated: Secondary | ICD-10-CM | POA: Diagnosis not present

## 2018-10-06 DIAGNOSIS — M79641 Pain in right hand: Secondary | ICD-10-CM | POA: Diagnosis not present

## 2018-10-06 DIAGNOSIS — E1151 Type 2 diabetes mellitus with diabetic peripheral angiopathy without gangrene: Secondary | ICD-10-CM | POA: Diagnosis not present

## 2018-11-02 DIAGNOSIS — D649 Anemia, unspecified: Secondary | ICD-10-CM | POA: Diagnosis not present

## 2018-11-02 DIAGNOSIS — R413 Other amnesia: Secondary | ICD-10-CM | POA: Diagnosis not present

## 2018-11-02 DIAGNOSIS — M159 Polyosteoarthritis, unspecified: Secondary | ICD-10-CM | POA: Diagnosis not present

## 2018-11-02 DIAGNOSIS — Z0000 Encounter for general adult medical examination without abnormal findings: Secondary | ICD-10-CM | POA: Diagnosis not present

## 2018-11-02 DIAGNOSIS — R5383 Other fatigue: Secondary | ICD-10-CM | POA: Diagnosis not present

## 2018-11-02 DIAGNOSIS — E1165 Type 2 diabetes mellitus with hyperglycemia: Secondary | ICD-10-CM | POA: Diagnosis not present

## 2018-11-02 DIAGNOSIS — I10 Essential (primary) hypertension: Secondary | ICD-10-CM | POA: Diagnosis not present

## 2018-11-02 DIAGNOSIS — E782 Mixed hyperlipidemia: Secondary | ICD-10-CM | POA: Diagnosis not present

## 2018-11-02 DIAGNOSIS — E039 Hypothyroidism, unspecified: Secondary | ICD-10-CM | POA: Diagnosis not present

## 2019-01-24 DIAGNOSIS — L578 Other skin changes due to chronic exposure to nonionizing radiation: Secondary | ICD-10-CM | POA: Diagnosis not present

## 2019-01-24 DIAGNOSIS — L720 Epidermal cyst: Secondary | ICD-10-CM | POA: Diagnosis not present

## 2019-01-24 DIAGNOSIS — L918 Other hypertrophic disorders of the skin: Secondary | ICD-10-CM | POA: Diagnosis not present

## 2019-01-24 DIAGNOSIS — Z85820 Personal history of malignant melanoma of skin: Secondary | ICD-10-CM | POA: Diagnosis not present

## 2019-01-24 DIAGNOSIS — L219 Seborrheic dermatitis, unspecified: Secondary | ICD-10-CM | POA: Diagnosis not present

## 2019-01-24 DIAGNOSIS — D1800 Hemangioma unspecified site: Secondary | ICD-10-CM | POA: Diagnosis not present

## 2019-01-24 DIAGNOSIS — I8393 Asymptomatic varicose veins of bilateral lower extremities: Secondary | ICD-10-CM | POA: Diagnosis not present

## 2019-01-24 DIAGNOSIS — Z1283 Encounter for screening for malignant neoplasm of skin: Secondary | ICD-10-CM | POA: Diagnosis not present

## 2019-01-24 DIAGNOSIS — D229 Melanocytic nevi, unspecified: Secondary | ICD-10-CM | POA: Diagnosis not present

## 2019-01-24 DIAGNOSIS — Z85828 Personal history of other malignant neoplasm of skin: Secondary | ICD-10-CM | POA: Diagnosis not present

## 2019-01-24 DIAGNOSIS — L820 Inflamed seborrheic keratosis: Secondary | ICD-10-CM | POA: Diagnosis not present

## 2019-01-24 DIAGNOSIS — D485 Neoplasm of uncertain behavior of skin: Secondary | ICD-10-CM | POA: Diagnosis not present

## 2019-02-23 DIAGNOSIS — M1711 Unilateral primary osteoarthritis, right knee: Secondary | ICD-10-CM | POA: Diagnosis not present

## 2019-02-23 DIAGNOSIS — I10 Essential (primary) hypertension: Secondary | ICD-10-CM | POA: Diagnosis not present

## 2019-02-23 DIAGNOSIS — E1165 Type 2 diabetes mellitus with hyperglycemia: Secondary | ICD-10-CM | POA: Diagnosis not present

## 2019-02-23 DIAGNOSIS — M25561 Pain in right knee: Secondary | ICD-10-CM | POA: Diagnosis not present

## 2019-02-23 DIAGNOSIS — Z0000 Encounter for general adult medical examination without abnormal findings: Secondary | ICD-10-CM | POA: Diagnosis not present

## 2019-02-23 DIAGNOSIS — E782 Mixed hyperlipidemia: Secondary | ICD-10-CM | POA: Diagnosis not present

## 2019-02-23 DIAGNOSIS — E039 Hypothyroidism, unspecified: Secondary | ICD-10-CM | POA: Diagnosis not present

## 2019-02-23 DIAGNOSIS — R413 Other amnesia: Secondary | ICD-10-CM | POA: Diagnosis not present

## 2019-03-01 DIAGNOSIS — E1165 Type 2 diabetes mellitus with hyperglycemia: Secondary | ICD-10-CM | POA: Diagnosis not present

## 2019-03-01 DIAGNOSIS — C44501 Unspecified malignant neoplasm of skin of breast: Secondary | ICD-10-CM | POA: Diagnosis not present

## 2019-03-01 DIAGNOSIS — Z0000 Encounter for general adult medical examination without abnormal findings: Secondary | ICD-10-CM | POA: Diagnosis not present

## 2019-03-01 DIAGNOSIS — E039 Hypothyroidism, unspecified: Secondary | ICD-10-CM | POA: Diagnosis not present

## 2019-03-01 DIAGNOSIS — E782 Mixed hyperlipidemia: Secondary | ICD-10-CM | POA: Diagnosis not present

## 2019-03-01 DIAGNOSIS — I10 Essential (primary) hypertension: Secondary | ICD-10-CM | POA: Diagnosis not present

## 2019-03-01 DIAGNOSIS — F411 Generalized anxiety disorder: Secondary | ICD-10-CM | POA: Diagnosis not present

## 2019-03-01 DIAGNOSIS — E119 Type 2 diabetes mellitus without complications: Secondary | ICD-10-CM | POA: Diagnosis not present

## 2019-03-01 DIAGNOSIS — M1990 Unspecified osteoarthritis, unspecified site: Secondary | ICD-10-CM | POA: Diagnosis not present

## 2019-03-01 DIAGNOSIS — D649 Anemia, unspecified: Secondary | ICD-10-CM | POA: Diagnosis not present

## 2019-03-07 DIAGNOSIS — R300 Dysuria: Secondary | ICD-10-CM | POA: Diagnosis not present

## 2019-05-20 DIAGNOSIS — M25561 Pain in right knee: Secondary | ICD-10-CM | POA: Diagnosis not present

## 2019-05-20 DIAGNOSIS — M1711 Unilateral primary osteoarthritis, right knee: Secondary | ICD-10-CM | POA: Diagnosis not present

## 2019-06-28 DIAGNOSIS — E119 Type 2 diabetes mellitus without complications: Secondary | ICD-10-CM | POA: Diagnosis not present

## 2019-06-28 DIAGNOSIS — M1990 Unspecified osteoarthritis, unspecified site: Secondary | ICD-10-CM | POA: Diagnosis not present

## 2019-06-28 DIAGNOSIS — D649 Anemia, unspecified: Secondary | ICD-10-CM | POA: Diagnosis not present

## 2019-06-28 DIAGNOSIS — E782 Mixed hyperlipidemia: Secondary | ICD-10-CM | POA: Diagnosis not present

## 2019-06-28 DIAGNOSIS — C44501 Unspecified malignant neoplasm of skin of breast: Secondary | ICD-10-CM | POA: Diagnosis not present

## 2019-06-28 DIAGNOSIS — E1165 Type 2 diabetes mellitus with hyperglycemia: Secondary | ICD-10-CM | POA: Diagnosis not present

## 2019-06-28 DIAGNOSIS — E039 Hypothyroidism, unspecified: Secondary | ICD-10-CM | POA: Diagnosis not present

## 2019-06-28 DIAGNOSIS — I10 Essential (primary) hypertension: Secondary | ICD-10-CM | POA: Diagnosis not present

## 2019-07-05 DIAGNOSIS — I10 Essential (primary) hypertension: Secondary | ICD-10-CM | POA: Diagnosis not present

## 2019-07-05 DIAGNOSIS — E119 Type 2 diabetes mellitus without complications: Secondary | ICD-10-CM | POA: Diagnosis not present

## 2019-07-05 DIAGNOSIS — D649 Anemia, unspecified: Secondary | ICD-10-CM | POA: Diagnosis not present

## 2019-07-05 DIAGNOSIS — E782 Mixed hyperlipidemia: Secondary | ICD-10-CM | POA: Diagnosis not present

## 2019-07-05 DIAGNOSIS — M1711 Unilateral primary osteoarthritis, right knee: Secondary | ICD-10-CM | POA: Diagnosis not present

## 2019-07-05 DIAGNOSIS — R0602 Shortness of breath: Secondary | ICD-10-CM | POA: Diagnosis not present

## 2019-07-05 DIAGNOSIS — E039 Hypothyroidism, unspecified: Secondary | ICD-10-CM | POA: Diagnosis not present

## 2019-07-05 DIAGNOSIS — Z23 Encounter for immunization: Secondary | ICD-10-CM | POA: Diagnosis not present

## 2019-07-10 DIAGNOSIS — M1711 Unilateral primary osteoarthritis, right knee: Secondary | ICD-10-CM | POA: Insufficient documentation

## 2019-12-09 ENCOUNTER — Other Ambulatory Visit: Payer: Self-pay | Admitting: Internal Medicine

## 2019-12-09 DIAGNOSIS — I10 Essential (primary) hypertension: Secondary | ICD-10-CM | POA: Diagnosis not present

## 2019-12-09 DIAGNOSIS — E782 Mixed hyperlipidemia: Secondary | ICD-10-CM | POA: Diagnosis not present

## 2019-12-09 DIAGNOSIS — E1165 Type 2 diabetes mellitus with hyperglycemia: Secondary | ICD-10-CM | POA: Diagnosis not present

## 2019-12-09 DIAGNOSIS — R519 Headache, unspecified: Secondary | ICD-10-CM | POA: Diagnosis not present

## 2019-12-09 DIAGNOSIS — R410 Disorientation, unspecified: Secondary | ICD-10-CM

## 2019-12-09 DIAGNOSIS — R413 Other amnesia: Secondary | ICD-10-CM | POA: Diagnosis not present

## 2019-12-09 DIAGNOSIS — E039 Hypothyroidism, unspecified: Secondary | ICD-10-CM | POA: Diagnosis not present

## 2019-12-09 DIAGNOSIS — D649 Anemia, unspecified: Secondary | ICD-10-CM | POA: Diagnosis not present

## 2019-12-09 DIAGNOSIS — M1711 Unilateral primary osteoarthritis, right knee: Secondary | ICD-10-CM | POA: Diagnosis not present

## 2019-12-09 DIAGNOSIS — E119 Type 2 diabetes mellitus without complications: Secondary | ICD-10-CM | POA: Diagnosis not present

## 2019-12-09 DIAGNOSIS — R0602 Shortness of breath: Secondary | ICD-10-CM | POA: Diagnosis not present

## 2019-12-13 DIAGNOSIS — Z111 Encounter for screening for respiratory tuberculosis: Secondary | ICD-10-CM | POA: Diagnosis not present

## 2019-12-22 ENCOUNTER — Ambulatory Visit: Payer: PPO

## 2019-12-23 DIAGNOSIS — Z209 Contact with and (suspected) exposure to unspecified communicable disease: Secondary | ICD-10-CM | POA: Diagnosis not present

## 2019-12-23 DIAGNOSIS — Z03818 Encounter for observation for suspected exposure to other biological agents ruled out: Secondary | ICD-10-CM | POA: Diagnosis not present

## 2019-12-23 DIAGNOSIS — R030 Elevated blood-pressure reading, without diagnosis of hypertension: Secondary | ICD-10-CM | POA: Diagnosis not present

## 2020-01-03 DIAGNOSIS — G309 Alzheimer's disease, unspecified: Secondary | ICD-10-CM | POA: Diagnosis not present

## 2020-01-03 DIAGNOSIS — E039 Hypothyroidism, unspecified: Secondary | ICD-10-CM | POA: Diagnosis not present

## 2020-01-03 DIAGNOSIS — F0280 Dementia in other diseases classified elsewhere without behavioral disturbance: Secondary | ICD-10-CM | POA: Diagnosis not present

## 2020-01-03 DIAGNOSIS — F330 Major depressive disorder, recurrent, mild: Secondary | ICD-10-CM | POA: Diagnosis not present

## 2020-01-03 DIAGNOSIS — F0150 Vascular dementia without behavioral disturbance: Secondary | ICD-10-CM | POA: Diagnosis not present

## 2020-01-03 DIAGNOSIS — Z0000 Encounter for general adult medical examination without abnormal findings: Secondary | ICD-10-CM | POA: Diagnosis not present

## 2020-01-03 DIAGNOSIS — F411 Generalized anxiety disorder: Secondary | ICD-10-CM | POA: Diagnosis not present

## 2020-01-03 DIAGNOSIS — R454 Irritability and anger: Secondary | ICD-10-CM | POA: Diagnosis not present

## 2020-01-03 DIAGNOSIS — I10 Essential (primary) hypertension: Secondary | ICD-10-CM | POA: Diagnosis not present

## 2020-01-03 DIAGNOSIS — E782 Mixed hyperlipidemia: Secondary | ICD-10-CM | POA: Diagnosis not present

## 2020-01-03 DIAGNOSIS — M1711 Unilateral primary osteoarthritis, right knee: Secondary | ICD-10-CM | POA: Diagnosis not present

## 2020-01-03 DIAGNOSIS — E119 Type 2 diabetes mellitus without complications: Secondary | ICD-10-CM | POA: Diagnosis not present

## 2020-01-05 ENCOUNTER — Ambulatory Visit: Admission: RE | Admit: 2020-01-05 | Payer: PPO | Source: Ambulatory Visit

## 2020-02-03 DIAGNOSIS — Z7984 Long term (current) use of oral hypoglycemic drugs: Secondary | ICD-10-CM | POA: Diagnosis not present

## 2020-02-03 DIAGNOSIS — M1711 Unilateral primary osteoarthritis, right knee: Secondary | ICD-10-CM | POA: Diagnosis not present

## 2020-02-03 DIAGNOSIS — E119 Type 2 diabetes mellitus without complications: Secondary | ICD-10-CM | POA: Diagnosis not present

## 2020-02-03 DIAGNOSIS — F0151 Vascular dementia with behavioral disturbance: Secondary | ICD-10-CM | POA: Diagnosis not present

## 2020-02-03 DIAGNOSIS — I69311 Memory deficit following cerebral infarction: Secondary | ICD-10-CM | POA: Diagnosis not present

## 2020-02-03 DIAGNOSIS — G309 Alzheimer's disease, unspecified: Secondary | ICD-10-CM | POA: Diagnosis not present

## 2020-02-03 DIAGNOSIS — R2681 Unsteadiness on feet: Secondary | ICD-10-CM | POA: Diagnosis not present

## 2020-02-03 DIAGNOSIS — F0281 Dementia in other diseases classified elsewhere with behavioral disturbance: Secondary | ICD-10-CM | POA: Diagnosis not present

## 2020-02-03 DIAGNOSIS — I10 Essential (primary) hypertension: Secondary | ICD-10-CM | POA: Diagnosis not present

## 2020-02-15 DIAGNOSIS — Z20828 Contact with and (suspected) exposure to other viral communicable diseases: Secondary | ICD-10-CM | POA: Diagnosis not present

## 2020-02-15 DIAGNOSIS — U071 COVID-19: Secondary | ICD-10-CM | POA: Diagnosis not present

## 2020-02-17 DIAGNOSIS — I69311 Memory deficit following cerebral infarction: Secondary | ICD-10-CM | POA: Diagnosis not present

## 2020-02-17 DIAGNOSIS — R2681 Unsteadiness on feet: Secondary | ICD-10-CM | POA: Diagnosis not present

## 2020-02-17 DIAGNOSIS — M1711 Unilateral primary osteoarthritis, right knee: Secondary | ICD-10-CM | POA: Diagnosis not present

## 2020-02-17 DIAGNOSIS — E119 Type 2 diabetes mellitus without complications: Secondary | ICD-10-CM | POA: Diagnosis not present

## 2020-02-17 DIAGNOSIS — F0151 Vascular dementia with behavioral disturbance: Secondary | ICD-10-CM | POA: Diagnosis not present

## 2020-02-17 DIAGNOSIS — F0281 Dementia in other diseases classified elsewhere with behavioral disturbance: Secondary | ICD-10-CM | POA: Diagnosis not present

## 2020-02-17 DIAGNOSIS — G309 Alzheimer's disease, unspecified: Secondary | ICD-10-CM | POA: Diagnosis not present

## 2020-02-24 ENCOUNTER — Encounter (INDEPENDENT_AMBULATORY_CARE_PROVIDER_SITE_OTHER): Payer: Self-pay

## 2020-02-24 ENCOUNTER — Other Ambulatory Visit (HOSPITAL_COMMUNITY): Payer: Self-pay | Admitting: Internal Medicine

## 2020-02-24 ENCOUNTER — Ambulatory Visit
Admission: RE | Admit: 2020-02-24 | Discharge: 2020-02-24 | Disposition: A | Discharge status: Home / Self Care | Payer: PPO | Source: Ambulatory Visit | Attending: Internal Medicine | Admitting: Internal Medicine

## 2020-02-24 ENCOUNTER — Other Ambulatory Visit: Payer: Self-pay

## 2020-02-24 ENCOUNTER — Other Ambulatory Visit: Payer: Self-pay | Admitting: Internal Medicine

## 2020-02-24 DIAGNOSIS — F0150 Vascular dementia without behavioral disturbance: Secondary | ICD-10-CM | POA: Diagnosis not present

## 2020-02-24 DIAGNOSIS — R600 Localized edema: Secondary | ICD-10-CM | POA: Diagnosis not present

## 2020-02-24 DIAGNOSIS — E039 Hypothyroidism, unspecified: Secondary | ICD-10-CM | POA: Diagnosis not present

## 2020-02-24 DIAGNOSIS — F0280 Dementia in other diseases classified elsewhere without behavioral disturbance: Secondary | ICD-10-CM | POA: Diagnosis not present

## 2020-02-24 DIAGNOSIS — E119 Type 2 diabetes mellitus without complications: Secondary | ICD-10-CM | POA: Diagnosis not present

## 2020-02-24 DIAGNOSIS — F0391 Unspecified dementia with behavioral disturbance: Secondary | ICD-10-CM | POA: Diagnosis not present

## 2020-02-24 DIAGNOSIS — G309 Alzheimer's disease, unspecified: Secondary | ICD-10-CM | POA: Diagnosis not present

## 2020-02-29 DIAGNOSIS — R2681 Unsteadiness on feet: Secondary | ICD-10-CM | POA: Diagnosis not present

## 2020-02-29 DIAGNOSIS — M7989 Other specified soft tissue disorders: Secondary | ICD-10-CM | POA: Diagnosis not present

## 2020-02-29 DIAGNOSIS — M1711 Unilateral primary osteoarthritis, right knee: Secondary | ICD-10-CM | POA: Diagnosis not present

## 2020-02-29 DIAGNOSIS — S6992XA Unspecified injury of left wrist, hand and finger(s), initial encounter: Secondary | ICD-10-CM | POA: Diagnosis not present

## 2020-02-29 DIAGNOSIS — Z7984 Long term (current) use of oral hypoglycemic drugs: Secondary | ICD-10-CM | POA: Diagnosis not present

## 2020-02-29 DIAGNOSIS — I10 Essential (primary) hypertension: Secondary | ICD-10-CM | POA: Diagnosis not present

## 2020-02-29 DIAGNOSIS — M25532 Pain in left wrist: Secondary | ICD-10-CM | POA: Diagnosis not present

## 2020-02-29 DIAGNOSIS — S299XXA Unspecified injury of thorax, initial encounter: Secondary | ICD-10-CM | POA: Diagnosis not present

## 2020-02-29 DIAGNOSIS — F0391 Unspecified dementia with behavioral disturbance: Secondary | ICD-10-CM | POA: Diagnosis not present

## 2020-02-29 DIAGNOSIS — F0281 Dementia in other diseases classified elsewhere with behavioral disturbance: Secondary | ICD-10-CM | POA: Diagnosis not present

## 2020-02-29 DIAGNOSIS — Y92009 Unspecified place in unspecified non-institutional (private) residence as the place of occurrence of the external cause: Secondary | ICD-10-CM | POA: Diagnosis not present

## 2020-02-29 DIAGNOSIS — T50905A Adverse effect of unspecified drugs, medicaments and biological substances, initial encounter: Secondary | ICD-10-CM | POA: Diagnosis not present

## 2020-02-29 DIAGNOSIS — W010XXA Fall on same level from slipping, tripping and stumbling without subsequent striking against object, initial encounter: Secondary | ICD-10-CM | POA: Diagnosis not present

## 2020-02-29 DIAGNOSIS — I69311 Memory deficit following cerebral infarction: Secondary | ICD-10-CM | POA: Diagnosis not present

## 2020-02-29 DIAGNOSIS — F0151 Vascular dementia with behavioral disturbance: Secondary | ICD-10-CM | POA: Diagnosis not present

## 2020-02-29 DIAGNOSIS — G309 Alzheimer's disease, unspecified: Secondary | ICD-10-CM | POA: Diagnosis not present

## 2020-02-29 DIAGNOSIS — E119 Type 2 diabetes mellitus without complications: Secondary | ICD-10-CM | POA: Diagnosis not present

## 2020-02-29 DIAGNOSIS — M533 Sacrococcygeal disorders, not elsewhere classified: Secondary | ICD-10-CM | POA: Diagnosis not present

## 2020-03-02 DIAGNOSIS — E876 Hypokalemia: Secondary | ICD-10-CM | POA: Diagnosis not present

## 2020-03-02 DIAGNOSIS — Z9181 History of falling: Secondary | ICD-10-CM | POA: Diagnosis not present

## 2020-03-02 DIAGNOSIS — R410 Disorientation, unspecified: Secondary | ICD-10-CM | POA: Diagnosis not present

## 2020-03-02 DIAGNOSIS — E039 Hypothyroidism, unspecified: Secondary | ICD-10-CM | POA: Diagnosis not present

## 2020-03-02 DIAGNOSIS — F0150 Vascular dementia without behavioral disturbance: Secondary | ICD-10-CM | POA: Diagnosis not present

## 2020-03-02 DIAGNOSIS — F0280 Dementia in other diseases classified elsewhere without behavioral disturbance: Secondary | ICD-10-CM | POA: Diagnosis not present

## 2020-03-02 DIAGNOSIS — E119 Type 2 diabetes mellitus without complications: Secondary | ICD-10-CM | POA: Diagnosis not present

## 2020-03-02 DIAGNOSIS — I10 Essential (primary) hypertension: Secondary | ICD-10-CM | POA: Diagnosis not present

## 2020-03-02 DIAGNOSIS — G309 Alzheimer's disease, unspecified: Secondary | ICD-10-CM | POA: Diagnosis not present

## 2020-03-02 DIAGNOSIS — M25532 Pain in left wrist: Secondary | ICD-10-CM | POA: Diagnosis not present

## 2020-03-02 DIAGNOSIS — R600 Localized edema: Secondary | ICD-10-CM | POA: Diagnosis not present

## 2020-04-30 DIAGNOSIS — R454 Irritability and anger: Secondary | ICD-10-CM | POA: Diagnosis not present

## 2020-04-30 DIAGNOSIS — E782 Mixed hyperlipidemia: Secondary | ICD-10-CM | POA: Diagnosis not present

## 2020-04-30 DIAGNOSIS — E039 Hypothyroidism, unspecified: Secondary | ICD-10-CM | POA: Diagnosis not present

## 2020-04-30 DIAGNOSIS — Z0000 Encounter for general adult medical examination without abnormal findings: Secondary | ICD-10-CM | POA: Diagnosis not present

## 2020-04-30 DIAGNOSIS — F0280 Dementia in other diseases classified elsewhere without behavioral disturbance: Secondary | ICD-10-CM | POA: Diagnosis not present

## 2020-04-30 DIAGNOSIS — I10 Essential (primary) hypertension: Secondary | ICD-10-CM | POA: Diagnosis not present

## 2020-04-30 DIAGNOSIS — E119 Type 2 diabetes mellitus without complications: Secondary | ICD-10-CM | POA: Diagnosis not present

## 2020-04-30 DIAGNOSIS — F330 Major depressive disorder, recurrent, mild: Secondary | ICD-10-CM | POA: Diagnosis not present

## 2020-04-30 DIAGNOSIS — G309 Alzheimer's disease, unspecified: Secondary | ICD-10-CM | POA: Diagnosis not present

## 2020-04-30 DIAGNOSIS — F411 Generalized anxiety disorder: Secondary | ICD-10-CM | POA: Diagnosis not present

## 2020-04-30 DIAGNOSIS — M1711 Unilateral primary osteoarthritis, right knee: Secondary | ICD-10-CM | POA: Diagnosis not present

## 2020-04-30 DIAGNOSIS — F0150 Vascular dementia without behavioral disturbance: Secondary | ICD-10-CM | POA: Diagnosis not present

## 2020-04-30 DIAGNOSIS — R8290 Unspecified abnormal findings in urine: Secondary | ICD-10-CM | POA: Diagnosis not present

## 2020-05-07 DIAGNOSIS — G4700 Insomnia, unspecified: Secondary | ICD-10-CM | POA: Diagnosis not present

## 2020-05-07 DIAGNOSIS — C73 Malignant neoplasm of thyroid gland: Secondary | ICD-10-CM | POA: Diagnosis not present

## 2020-05-07 DIAGNOSIS — E039 Hypothyroidism, unspecified: Secondary | ICD-10-CM | POA: Diagnosis not present

## 2020-05-07 DIAGNOSIS — N390 Urinary tract infection, site not specified: Secondary | ICD-10-CM | POA: Diagnosis not present

## 2020-05-07 DIAGNOSIS — Z23 Encounter for immunization: Secondary | ICD-10-CM | POA: Diagnosis not present

## 2020-05-07 DIAGNOSIS — G309 Alzheimer's disease, unspecified: Secondary | ICD-10-CM | POA: Diagnosis not present

## 2020-05-07 DIAGNOSIS — R600 Localized edema: Secondary | ICD-10-CM | POA: Diagnosis not present

## 2020-05-07 DIAGNOSIS — E782 Mixed hyperlipidemia: Secondary | ICD-10-CM | POA: Diagnosis not present

## 2020-05-07 DIAGNOSIS — F0150 Vascular dementia without behavioral disturbance: Secondary | ICD-10-CM | POA: Diagnosis not present

## 2020-05-07 DIAGNOSIS — E119 Type 2 diabetes mellitus without complications: Secondary | ICD-10-CM | POA: Diagnosis not present

## 2020-05-07 DIAGNOSIS — I10 Essential (primary) hypertension: Secondary | ICD-10-CM | POA: Diagnosis not present

## 2020-05-07 DIAGNOSIS — M1711 Unilateral primary osteoarthritis, right knee: Secondary | ICD-10-CM | POA: Diagnosis not present

## 2020-05-10 DIAGNOSIS — I69311 Memory deficit following cerebral infarction: Secondary | ICD-10-CM | POA: Diagnosis not present

## 2020-05-10 DIAGNOSIS — E782 Mixed hyperlipidemia: Secondary | ICD-10-CM | POA: Diagnosis not present

## 2020-05-10 DIAGNOSIS — F0281 Dementia in other diseases classified elsewhere with behavioral disturbance: Secondary | ICD-10-CM | POA: Diagnosis not present

## 2020-05-10 DIAGNOSIS — M6281 Muscle weakness (generalized): Secondary | ICD-10-CM | POA: Diagnosis not present

## 2020-05-10 DIAGNOSIS — I10 Essential (primary) hypertension: Secondary | ICD-10-CM | POA: Diagnosis not present

## 2020-05-10 DIAGNOSIS — F0151 Vascular dementia with behavioral disturbance: Secondary | ICD-10-CM | POA: Diagnosis not present

## 2020-05-10 DIAGNOSIS — E119 Type 2 diabetes mellitus without complications: Secondary | ICD-10-CM | POA: Diagnosis not present

## 2020-05-10 DIAGNOSIS — G309 Alzheimer's disease, unspecified: Secondary | ICD-10-CM | POA: Diagnosis not present

## 2020-05-10 DIAGNOSIS — M1711 Unilateral primary osteoarthritis, right knee: Secondary | ICD-10-CM | POA: Diagnosis not present

## 2020-05-10 DIAGNOSIS — E038 Other specified hypothyroidism: Secondary | ICD-10-CM | POA: Diagnosis not present

## 2020-05-16 DIAGNOSIS — I10 Essential (primary) hypertension: Secondary | ICD-10-CM | POA: Diagnosis not present

## 2020-05-16 DIAGNOSIS — G309 Alzheimer's disease, unspecified: Secondary | ICD-10-CM | POA: Diagnosis not present

## 2020-05-16 DIAGNOSIS — F0151 Vascular dementia with behavioral disturbance: Secondary | ICD-10-CM | POA: Diagnosis not present

## 2020-05-16 DIAGNOSIS — F0281 Dementia in other diseases classified elsewhere with behavioral disturbance: Secondary | ICD-10-CM | POA: Diagnosis not present

## 2020-05-16 DIAGNOSIS — I69311 Memory deficit following cerebral infarction: Secondary | ICD-10-CM | POA: Diagnosis not present

## 2020-05-16 DIAGNOSIS — M1711 Unilateral primary osteoarthritis, right knee: Secondary | ICD-10-CM | POA: Diagnosis not present

## 2020-05-16 DIAGNOSIS — E119 Type 2 diabetes mellitus without complications: Secondary | ICD-10-CM | POA: Diagnosis not present

## 2020-05-30 DIAGNOSIS — I69311 Memory deficit following cerebral infarction: Secondary | ICD-10-CM | POA: Diagnosis not present

## 2020-05-30 DIAGNOSIS — E119 Type 2 diabetes mellitus without complications: Secondary | ICD-10-CM | POA: Diagnosis not present

## 2020-05-30 DIAGNOSIS — E038 Other specified hypothyroidism: Secondary | ICD-10-CM | POA: Diagnosis not present

## 2020-05-30 DIAGNOSIS — G309 Alzheimer's disease, unspecified: Secondary | ICD-10-CM | POA: Diagnosis not present

## 2020-05-30 DIAGNOSIS — M1711 Unilateral primary osteoarthritis, right knee: Secondary | ICD-10-CM | POA: Diagnosis not present

## 2020-05-30 DIAGNOSIS — M6281 Muscle weakness (generalized): Secondary | ICD-10-CM | POA: Diagnosis not present

## 2020-05-30 DIAGNOSIS — F0281 Dementia in other diseases classified elsewhere with behavioral disturbance: Secondary | ICD-10-CM | POA: Diagnosis not present

## 2020-05-30 DIAGNOSIS — E782 Mixed hyperlipidemia: Secondary | ICD-10-CM | POA: Diagnosis not present

## 2020-05-30 DIAGNOSIS — I10 Essential (primary) hypertension: Secondary | ICD-10-CM | POA: Diagnosis not present

## 2020-05-30 DIAGNOSIS — F0151 Vascular dementia with behavioral disturbance: Secondary | ICD-10-CM | POA: Diagnosis not present

## 2020-06-13 DIAGNOSIS — E782 Mixed hyperlipidemia: Secondary | ICD-10-CM | POA: Diagnosis not present

## 2020-06-13 DIAGNOSIS — C73 Malignant neoplasm of thyroid gland: Secondary | ICD-10-CM | POA: Diagnosis not present

## 2020-06-13 DIAGNOSIS — E119 Type 2 diabetes mellitus without complications: Secondary | ICD-10-CM | POA: Diagnosis not present

## 2020-06-13 DIAGNOSIS — G309 Alzheimer's disease, unspecified: Secondary | ICD-10-CM | POA: Diagnosis not present

## 2020-06-13 DIAGNOSIS — E039 Hypothyroidism, unspecified: Secondary | ICD-10-CM | POA: Diagnosis not present

## 2020-06-13 DIAGNOSIS — F0150 Vascular dementia without behavioral disturbance: Secondary | ICD-10-CM | POA: Diagnosis not present

## 2020-06-13 DIAGNOSIS — I10 Essential (primary) hypertension: Secondary | ICD-10-CM | POA: Diagnosis not present

## 2020-06-13 DIAGNOSIS — F0280 Dementia in other diseases classified elsewhere without behavioral disturbance: Secondary | ICD-10-CM | POA: Diagnosis not present

## 2020-06-13 DIAGNOSIS — M1711 Unilateral primary osteoarthritis, right knee: Secondary | ICD-10-CM | POA: Diagnosis not present

## 2020-06-19 ENCOUNTER — Encounter (INDEPENDENT_AMBULATORY_CARE_PROVIDER_SITE_OTHER): Payer: PPO | Admitting: Vascular Surgery

## 2020-06-19 ENCOUNTER — Other Ambulatory Visit: Payer: Self-pay

## 2020-06-22 ENCOUNTER — Other Ambulatory Visit: Payer: Self-pay

## 2020-06-22 ENCOUNTER — Ambulatory Visit (INDEPENDENT_AMBULATORY_CARE_PROVIDER_SITE_OTHER): Payer: PPO | Admitting: Vascular Surgery

## 2020-06-22 ENCOUNTER — Encounter (INDEPENDENT_AMBULATORY_CARE_PROVIDER_SITE_OTHER): Payer: Self-pay | Admitting: Vascular Surgery

## 2020-06-22 VITALS — BP 194/77 | HR 67 | Resp 16 | Ht 62.0 in | Wt 196.0 lb

## 2020-06-22 DIAGNOSIS — E119 Type 2 diabetes mellitus without complications: Secondary | ICD-10-CM

## 2020-06-22 DIAGNOSIS — C44501 Unspecified malignant neoplasm of skin of breast: Secondary | ICD-10-CM | POA: Insufficient documentation

## 2020-06-22 DIAGNOSIS — I10 Essential (primary) hypertension: Secondary | ICD-10-CM | POA: Diagnosis not present

## 2020-06-22 DIAGNOSIS — R600 Localized edema: Secondary | ICD-10-CM

## 2020-06-22 DIAGNOSIS — M1990 Unspecified osteoarthritis, unspecified site: Secondary | ICD-10-CM | POA: Insufficient documentation

## 2020-06-22 DIAGNOSIS — E782 Mixed hyperlipidemia: Secondary | ICD-10-CM

## 2020-06-22 DIAGNOSIS — F0280 Dementia in other diseases classified elsewhere without behavioral disturbance: Secondary | ICD-10-CM | POA: Diagnosis not present

## 2020-06-22 DIAGNOSIS — F0150 Vascular dementia without behavioral disturbance: Secondary | ICD-10-CM

## 2020-06-22 DIAGNOSIS — E1122 Type 2 diabetes mellitus with diabetic chronic kidney disease: Secondary | ICD-10-CM | POA: Insufficient documentation

## 2020-06-22 DIAGNOSIS — G309 Alzheimer's disease, unspecified: Secondary | ICD-10-CM

## 2020-06-22 NOTE — Assessment & Plan Note (Signed)

## 2020-06-22 NOTE — Assessment & Plan Note (Signed)
Fairly significant.  In a memory care setting.

## 2020-06-22 NOTE — Assessment & Plan Note (Signed)
blood pressure control important in reducing the progression of atherosclerotic disease. On appropriate oral medications.  

## 2020-06-22 NOTE — Assessment & Plan Note (Signed)
lipid control important in reducing the progression of atherosclerotic disease. Continue statin therapy  

## 2020-06-22 NOTE — Patient Instructions (Signed)
Edema  Edema is when you have too much fluid in your body or under your skin. Edema may make your legs, feet, and ankles swell up. Swelling is also common in looser tissues, like around your eyes. This is a common condition. It gets more common as you get older. There are many possible causes of edema. Eating too much salt (sodium) and being on your feet or sitting for a long time can cause edema in your legs, feet, and ankles. Hot weather may make edema worse. Edema is usually painless. Your skin may look swollen or shiny. Follow these instructions at home:  Keep the swollen body part raised (elevated) above the level of your heart when you are sitting or lying down.  Do not sit still or stand for a long time.  Do not wear tight clothes. Do not wear garters on your upper legs.  Exercise your legs. This can help the swelling go down.  Wear elastic bandages or support stockings as told by your doctor.  Eat a low-salt (low-sodium) diet to reduce fluid as told by your doctor.  Depending on the cause of your swelling, you may need to limit how much fluid you drink (fluid restriction).  Take over-the-counter and prescription medicines only as told by your doctor. Contact a doctor if:  Treatment is not working.  You have heart, liver, or kidney disease and have symptoms of edema.  You have sudden and unexplained weight gain. Get help right away if:  You have shortness of breath or chest pain.  You cannot breathe when you lie down.  You have pain, redness, or warmth in the swollen areas.  You have heart, liver, or kidney disease and get edema all of a sudden.  You have a fever and your symptoms get worse all of a sudden. Summary  Edema is when you have too much fluid in your body or under your skin.  Edema may make your legs, feet, and ankles swell up. Swelling is also common in looser tissues, like around your eyes.  Raise (elevate) the swollen body part above the level of your  heart when you are sitting or lying down.  Follow your doctor's instructions about diet and how much fluid you can drink (fluid restriction). This information is not intended to replace advice given to you by your health care provider. Make sure you discuss any questions you have with your health care provider. Document Revised: 09/18/2017 Document Reviewed: 10/03/2016 Elsevier Patient Education  2020 [employer].  

## 2020-06-22 NOTE — Progress Notes (Signed)
Patient ID: Alice Weaver, female   DOB: September 11, 1941, 79 y.o.   MRN: [id number]  Chief Complaint  Patient presents with  . New Patient (Initial Visit)    ref Hande rle discoloration    HPI Alice Weaver is a 79 y.o. female.  I am asked to see the patient by Dr. Ginette Pitman for evaluation of leg swelling and discoloration.  This has been an ongoing problem for years, but has worsened over the past several months.  This is resulted in marked decrease in her mobility which is exacerbated the situation.  Both legs are affected.  The left leg has a small scab on the anterior aspect with mild drainage.  Both legs are quite uncomfortable from being so swollen.  There has tried to be an increase in elevation of her legs, and she was wearing true compression stockings but now she has on something that feels sure like pantyhose.  The daughter provides most all of the history due to her dementia.  The daughter also reports that they have tried true compression stockings but they were very improperly sized and were far too big for her.  At this point, they have checked her for a DVT which was not present but have not done any other work-up.  She is on a diuretic.  Despite all this, her leg swelling continues to worsen.     Past Medical History:  Diagnosis Date  . Arthritis    all joints  . Asthma   . Cough    chronic  . Diabetes mellitus without complication ([place])   . Dysrhythmia    palpatations  . GERD (gastroesophageal reflux disease)   . Hypertension   . Hypothyroidism   . Osteopenia   . Peripheral vascular disease (HCC)    varicose veins  . Skin cancer    melanoma and squamous cell  . Spinal stenosis   . Thyroid cancer ([place]) 2016   Thyroidectomy  . TIA (transient ischemic attack) 2012   no deficits    Past Surgical History:  Procedure Laterality Date  . BREAST BIOPSY Right 03/11/2017   path pending [place] bx  . BREAST CYST ASPIRATION Bilateral 1980's  . BREAST EXCISIONAL BIOPSY Right  1980's   benign  . CATARACT EXTRACTION Left 01/17/15   [facility]  . CATARACT EXTRACTION W/PHACO Right 02/28/2015   Procedure: CATARACT EXTRACTION PHACO AND INTRAOCULAR LENS PLACEMENT (IOC);  Surgeon: Leandrew Koyanagi, MD;  Location: [facility];  Service: Ophthalmology;  Laterality: Right;  IVA BLOCK TORIC LENS DIABETIC  . CHOLECYSTECTOMY    . HAND SURGERY    . JOINT REPLACEMENT     knee  . MIDDLE EAR SURGERY    . ORIF FIBULA FRACTURE    . SKIN CANCER EXCISION    . TONSILLECTOMY    . WRIST SURGERY       Family History  Problem Relation Age of Onset  . Heart disease Mother   . Congenital heart disease Father   . Hypertension Father   no bleeding or clotting disorders   Social History   Tobacco Use  . Smoking status: Former Smoker    Quit date: 09/29/1993    Years since quitting: 26.7  . Smokeless tobacco: Never Used  Substance Use Topics  . Alcohol use: No  . Drug use: Never    Allergies  Allergen Reactions  . Ibandronic Acid Nausea Only and Rash    Redness of legs Other reaction(s): Unknown   . Rofecoxib  Other reaction(s): Other (See Comments) Swelling in legs and redness Other reaction(s): Other (See Comments) Swelling in legs and redness   . Tramadol Swelling    Other reaction(s): Other (See Comments) Swollen tongue and sore mouth Other reaction(s): Other (See Comments) Swollen tongue and sore mouth   . Adhesive [Tape] Itching    Older type bandaids  . Meloxicam Swelling  . Neosporin [Neomycin-Bacitracin Zn-Polymyx] Other (See Comments)    Doesn't work  . Other Itching    Older type bandaids    Current Outpatient Medications  Medication Sig Dispense Refill  . ALPRAZolam (XANAX) 0.25 MG tablet Take 0.25 mg by mouth at bedtime as needed for anxiety.    Marland Kitchen amitriptyline (ELAVIL) 50 MG tablet Take 50 mg by mouth at bedtime.    Marland Kitchen aspirin 81 MG tablet Take 81 mg by mouth 3 (three) times a week.    . Calcium Carbonate-Vitamin D (CALCIUM + D PO)  Take by mouth.    . cefUROXime (CEFTIN) 250 MG tablet Take 250 mg by mouth 2 (two) times daily with a meal.    . citalopram (CELEXA) 10 MG tablet Take 10 mg by mouth daily.    . Colchicine 0.6 MG CAPS Take by mouth as directed.    . ferrous sulfate 325 (65 FE) MG tablet Take 325 mg by mouth daily with breakfast.    . furosemide (LASIX) 40 MG tablet Take 40 mg by mouth.    Marland Kitchen glimepiride (AMARYL) 2 MG tablet Take 2 mg by mouth daily with breakfast.    . levothyroxine (SYNTHROID, LEVOTHROID) 150 MCG tablet Take 150 mcg by mouth daily before breakfast.    . lisinopril (PRINIVIL,ZESTRIL) 40 MG tablet Take 40 mg by mouth daily. AM    . metFORMIN (GLUCOPHAGE) 500 MG tablet Take by mouth 2 (two) times daily with a meal.    . metoprolol succinate (TOPROL-XL) 25 MG 24 hr tablet Take 25 mg by mouth daily.    . mirtazapine (REMERON) 7.5 MG tablet Take 7.5 mg by mouth at bedtime.    . Multiple Vitamin (DAILY VITE PO) Take by mouth daily.    . multivitamin-iron-minerals-folic acid (CENTRUM) chewable tablet Chew 1 tablet by mouth daily.    . nitrofurantoin (MACRODANTIN) 50 MG capsule Take 50 mg by mouth 2 (two) times daily.    . pravastatin (PRAVACHOL) 20 MG tablet Take 20 mg by mouth daily.     Marland Kitchen triamterene-hydrochlorothiazide (DYAZIDE) 37.5-25 MG per capsule Take 0.5 capsules by mouth daily. AM     . topiramate (TOPAMAX) 25 MG tablet Take 25 mg by mouth daily. (Patient not taking: Reported on 06/22/2020)     No current facility-administered medications for this visit.      REVIEW OF SYSTEMS (Negative unless checked)  Constitutional: [] Weight loss  [] Fever  [] Chills Cardiac: [] Chest pain   [] Chest pressure   [] Palpitations   [] Shortness of breath when laying flat   [] Shortness of breath at rest   [] Shortness of breath with exertion. Vascular:  [x] Pain in legs with walking   [] Pain in legs at rest   [] Pain in legs when laying flat   [] Claudication   [] Pain in feet when walking  [] Pain in feet at rest   [] Pain in feet when laying flat   [] History of DVT   [] Phlebitis   [x] Swelling in legs   [] Varicose veins   [] Non-healing ulcers Pulmonary:   [] Uses home oxygen   [] Productive cough   [] Hemoptysis   [] Wheeze  [] COPD   []   Asthma Neurologic:  [] Dizziness  [] Blackouts   [] Seizures   [] History of stroke   [] History of TIA  [] Aphasia   [] Temporary blindness   [] Dysphagia   [] Weakness or numbness in arms   [] Weakness or numbness in legs X dementia/memory issues Musculoskeletal:  [] Arthritis   [] Joint swelling   [] Joint pain   [] Low back pain Hematologic:  [] Easy bruising  [] Easy bleeding   [] Hypercoagulable state   [] Anemic  [] Hepatitis Gastrointestinal:  [] Blood in stool   [] Vomiting blood  [] Gastroesophageal reflux/heartburn   [] Abdominal pain Genitourinary:  [] Chronic kidney disease   [] Difficult urination  [] Frequent urination  [] Burning with urination   [] Hematuria Skin:  [] Rashes   [] Ulcers   [] Wounds Psychological:  [] History of anxiety   []  History of major depression.    Physical Exam BP (!) 194/77 (BP Location: Left Arm)   Pulse 67   Resp 16   Ht 5\' 2"  (1.575 m)   Wt 196 lb (88.9 kg)   BMI 35.85 kg/m  Gen:  WD/WN, NAD Head: [place]/AT, No temporalis wasting.  Ear/Nose/Throat: Hearing grossly intact, nares w/o erythema or drainage, oropharynx w/o Erythema/Exudate Eyes: Conjunctiva clear, sclera non-icteric  Neck: trachea midline.  No JVD.  Pulmonary:  Good air movement, respirations not labored, no use of accessory muscles  Cardiac: RRR, no JVD Vascular:  Vessel Right Left  Radial Palpable Palpable                                   Gastrointestinal:. No masses, surgical incisions, or scars. Musculoskeletal: M/S 5/5 throughout.  Extremities without ischemic changes.  No deformity or atrophy. 2-3+ BLE edema. Neurologic: Sensation grossly intact in extremities.  Symmetrical.  Speech is fluent. Motor exam as listed above. Psychiatric: Judgment and insight are poor.  Dermatologic:  small scab on the anterior aspect of the left lower leg.    Radiology No results found.  Labs No results found for this or any previous visit (from the past 2160 hour(s)).  Assessment/Plan:  Diabetes mellitus type 2, uncomplicated (HCC) blood glucose control important in reducing the progression of atherosclerotic disease. Also, involved in wound healing. On appropriate medications.   Hyperlipemia, mixed lipid control important in reducing the progression of atherosclerotic disease. Continue statin therapy   Hypertension blood pressure control important in reducing the progression of atherosclerotic disease. On appropriate oral medications.   Mixed Alzheimer's and vascular dementia ([place]) Fairly significant.  In a memory care setting.  Bilateral leg edema I have had a long discussion with the patient regarding swelling and why it  causes symptoms.  Patient will begin wearing graduated compression stockings class 1 (20-30 mmHg) on a daily basis a prescription was given. The patient will  beginning wearing the stockings first thing in the morning and removing them in the evening. The patient is instructed specifically not to sleep in the stockings.   In addition, behavioral modification will be initiated.  This will include frequent elevation, use of over the counter pain medications and exercise such as walking.  I have reviewed systemic causes for chronic edema such as liver, kidney and cardiac etiologies.  The patient denies problems with these organ systems.    Consideration for a lymph pump will also be made based upon the effectiveness of conservative therapy.  This would help to improve the edema control and prevent sequela such as ulcers and infections   Patient should undergo duplex ultrasound of the  venous system to ensure that DVT or reflux is not present.  The patient will follow-up with me after the ultrasound.        Leotis Pain 06/22/2020, 2:09 PM   This note  was created with Dragon medical transcription system.  Any errors from dictation are unintentional.

## 2020-06-22 NOTE — Assessment & Plan Note (Signed)
blood glucose control important in reducing the progression of atherosclerotic disease. Also, involved in wound healing. On appropriate medications.  

## 2020-07-20 ENCOUNTER — Encounter (INDEPENDENT_AMBULATORY_CARE_PROVIDER_SITE_OTHER): Payer: Self-pay | Admitting: Vascular Surgery

## 2020-07-20 ENCOUNTER — Ambulatory Visit (INDEPENDENT_AMBULATORY_CARE_PROVIDER_SITE_OTHER): Payer: PPO

## 2020-07-20 ENCOUNTER — Other Ambulatory Visit: Payer: Self-pay

## 2020-07-20 ENCOUNTER — Ambulatory Visit (INDEPENDENT_AMBULATORY_CARE_PROVIDER_SITE_OTHER): Payer: PPO | Admitting: Vascular Surgery

## 2020-07-20 VITALS — BP 172/74 | HR 62 | Ht 62.0 in | Wt 190.0 lb

## 2020-07-20 DIAGNOSIS — E782 Mixed hyperlipidemia: Secondary | ICD-10-CM

## 2020-07-20 DIAGNOSIS — G309 Alzheimer's disease, unspecified: Secondary | ICD-10-CM | POA: Diagnosis not present

## 2020-07-20 DIAGNOSIS — I872 Venous insufficiency (chronic) (peripheral): Secondary | ICD-10-CM | POA: Diagnosis not present

## 2020-07-20 DIAGNOSIS — E119 Type 2 diabetes mellitus without complications: Secondary | ICD-10-CM

## 2020-07-20 DIAGNOSIS — I10 Essential (primary) hypertension: Secondary | ICD-10-CM | POA: Diagnosis not present

## 2020-07-20 DIAGNOSIS — F0280 Dementia in other diseases classified elsewhere without behavioral disturbance: Secondary | ICD-10-CM

## 2020-07-20 DIAGNOSIS — F0150 Vascular dementia without behavioral disturbance: Secondary | ICD-10-CM

## 2020-07-20 DIAGNOSIS — I890 Lymphedema, not elsewhere classified: Secondary | ICD-10-CM | POA: Diagnosis not present

## 2020-07-20 DIAGNOSIS — R600 Localized edema: Secondary | ICD-10-CM | POA: Diagnosis not present

## 2020-07-20 NOTE — Progress Notes (Signed)
MRN : [id number]  Alice Weaver is a 79 y.o. (03-Dec-1940) female who presents with chief complaint of  Chief Complaint  Patient presents with  . Follow-up    pt con. BIL LE ven Reflux  .  History of Present Illness: Patient returns today in follow up of her leg swelling.  Her leg swelling is really not much improved with compression stockings and trying to increase her elevation.  She does live in a memory care unit so it is difficult to tell how much she really elevate her legs during the day.  No new ulcerations or infections.  No fevers or chills. Venous reflux study does show reflux in the great saphenous vein in the right leg as well as varicosity of the great saphenous vein in the left leg and the left small saphenous vein.  Current Outpatient Medications  Medication Sig Dispense Refill  . ALPRAZolam (XANAX) 0.25 MG tablet Take 0.25 mg by mouth at bedtime as needed for anxiety.    Marland Kitchen amitriptyline (ELAVIL) 50 MG tablet Take 50 mg by mouth at bedtime.    Marland Kitchen aspirin 81 MG tablet Take 81 mg by mouth 3 (three) times a week.    . Calcium Carbonate-Vitamin D (CALCIUM + D PO) Take by mouth.    . cefUROXime (CEFTIN) 250 MG tablet Take 250 mg by mouth 2 (two) times daily with a meal.    . citalopram (CELEXA) 10 MG tablet Take 10 mg by mouth daily.    . Colchicine 0.6 MG CAPS Take by mouth as directed.    . ferrous sulfate 325 (65 FE) MG tablet Take 325 mg by mouth daily with breakfast.    . furosemide (LASIX) 40 MG tablet Take 40 mg by mouth.    Marland Kitchen glimepiride (AMARYL) 2 MG tablet Take 2 mg by mouth daily with breakfast.    . levothyroxine (SYNTHROID, LEVOTHROID) 150 MCG tablet Take 150 mcg by mouth daily before breakfast.    . lisinopril (PRINIVIL,ZESTRIL) 40 MG tablet Take 40 mg by mouth daily. AM    . metFORMIN (GLUCOPHAGE) 500 MG tablet Take by mouth 2 (two) times daily with a meal.    . metoprolol succinate (TOPROL-XL) 25 MG 24 hr tablet Take 25 mg by mouth daily.    . mirtazapine  (REMERON) 7.5 MG tablet Take 7.5 mg by mouth at bedtime.    . Multiple Vitamin (DAILY VITE PO) Take by mouth daily.    . multivitamin-iron-minerals-folic acid (CENTRUM) chewable tablet Chew 1 tablet by mouth daily.    . nitrofurantoin (MACRODANTIN) 50 MG capsule Take 50 mg by mouth 2 (two) times daily.    . pravastatin (PRAVACHOL) 20 MG tablet Take 20 mg by mouth daily.     Marland Kitchen topiramate (TOPAMAX) 25 MG tablet Take 25 mg by mouth daily.     Marland Kitchen triamterene-hydrochlorothiazide (DYAZIDE) 37.5-25 MG per capsule Take 0.5 capsules by mouth daily. AM     . amitriptyline (ELAVIL) 25 MG tablet Take 25 mg by mouth at bedtime.    Marland Kitchen levothyroxine (SYNTHROID) 175 MCG tablet Take 175 mcg by mouth daily.     No current facility-administered medications for this visit.    Past Medical History:  Diagnosis Date  . Arthritis    all joints  . Asthma   . Cough    chronic  . Diabetes mellitus without complication ([place])   . Dysrhythmia    palpatations  . GERD (gastroesophageal reflux disease)   . Hypertension   .  Hypothyroidism   . Osteopenia   . Peripheral vascular disease (HCC)    varicose veins  . Skin cancer    melanoma and squamous cell  . Spinal stenosis   . Thyroid cancer ([place]) 2016   Thyroidectomy  . TIA (transient ischemic attack) 2012   no deficits    Past Surgical History:  Procedure Laterality Date  . BREAST BIOPSY Right 03/11/2017   path pending [place] bx  . BREAST CYST ASPIRATION Bilateral 1980's  . BREAST EXCISIONAL BIOPSY Right 1980's   benign  . CATARACT EXTRACTION Left 01/17/15   [facility]  . CATARACT EXTRACTION W/PHACO Right 02/28/2015   Procedure: CATARACT EXTRACTION PHACO AND INTRAOCULAR LENS PLACEMENT (IOC);  Surgeon: Leandrew Koyanagi, MD;  Location: [facility];  Service: Ophthalmology;  Laterality: Right;  IVA BLOCK TORIC LENS DIABETIC  . CHOLECYSTECTOMY    . HAND SURGERY    . JOINT REPLACEMENT     knee  . MIDDLE EAR SURGERY    . ORIF FIBULA FRACTURE    . SKIN  CANCER EXCISION    . TONSILLECTOMY    . WRIST SURGERY       Social History   Tobacco Use  . Smoking status: Former Smoker    Quit date: 09/29/1993    Years since quitting: 26.8  . Smokeless tobacco: Never Used  Substance Use Topics  . Alcohol use: No  . Drug use: Never      Family History  Problem Relation Age of Onset  . Heart disease Mother   . Congenital heart disease Father   . Hypertension Father      Allergies  Allergen Reactions  . Ibandronic Acid Nausea Only and Rash    Redness of legs Other reaction(s): Unknown   . Rofecoxib     Other reaction(s): Other (See Comments) Swelling in legs and redness Other reaction(s): Other (See Comments) Swelling in legs and redness   . Tramadol Swelling    Other reaction(s): Other (See Comments) Swollen tongue and sore mouth Other reaction(s): Other (See Comments) Swollen tongue and sore mouth   . Adhesive [Tape] Itching    Older type bandaids  . Meloxicam Swelling  . Neosporin [Neomycin-Bacitracin Zn-Polymyx] Other (See Comments)    Doesn't work  . Other Itching    Older type bandaids    REVIEW OF SYSTEMS (Negative unless checked)  Constitutional: [] ?Weight loss  [] ?Fever  [] ?Chills Cardiac: [] ?Chest pain   [] ?Chest pressure   [] ?Palpitations   [] ?Shortness of breath when laying flat   [] ?Shortness of breath at rest   [] ?Shortness of breath with exertion. Vascular:  [x] ?Pain in legs with walking   [] ?Pain in legs at rest   [] ?Pain in legs when laying flat   [] ?Claudication   [] ?Pain in feet when walking  [] ?Pain in feet at rest  [] ?Pain in feet when laying flat   [] ?History of DVT   [] ?Phlebitis   [x] ?Swelling in legs   [] ?Varicose veins   [] ?Non-healing ulcers Pulmonary:   [] ?Uses home oxygen   [] ?Productive cough   [] ?Hemoptysis   [] ?Wheeze  [] ?COPD   [x] ?Asthma Neurologic:  [] ?Dizziness  [] ?Blackouts   [] ?Seizures   [] ?History of stroke   [] ?History of TIA  [] ?Aphasia   [] ?Temporary blindness   [] ?Dysphagia    [] ?Weakness or numbness in arms   [] ?Weakness or numbness in legs X dementia/memory issues Musculoskeletal:  [x] ?Arthritis   [] ?Joint swelling   [x] ?Joint pain   [] ?Low back pain Hematologic:  [] ?Easy bruising  [] ?Easy bleeding   [] ?  Hypercoagulable state   [] ?Anemic  [] ?Hepatitis Gastrointestinal:  [] ?Blood in stool   [] ?Vomiting blood  [] ?Gastroesophageal reflux/heartburn   [] ?Abdominal pain Genitourinary:  [] ?Chronic kidney disease   [] ?Difficult urination  [] ?Frequent urination  [] ?Burning with urination   [] ?Hematuria Skin:  [] ?Rashes   [] ?Ulcers   [] ?Wounds Psychological:  [] ?History of anxiety   [] ? History of major depression.   Physical Examination  BP (!) 172/74   Pulse 62   Ht 5\' 2"  (1.575 m)   Wt 190 lb (86.2 kg)   BMI 34.75 kg/m  Gen:  WD/WN, NAD Head: [place]/AT, No temporalis wasting. Ear/Nose/Throat: Hearing grossly intact, nares w/o erythema or drainage Eyes: Conjunctiva clear. Sclera non-icteric Neck: Supple.  Trachea midline Pulmonary:  Good air movement, no use of accessory muscles.  Cardiac: RRR, no JVD Vascular:  Vessel Right Left  Radial Palpable Palpable               Musculoskeletal: M/S 5/5 throughout.  No deformity or atrophy. 2+ BLE edema. Neurologic: Sensation grossly intact in extremities.  Symmetrical.  Speech is fluent.  Psychiatric: Judgment  And insight are poor.  Daughter provides history. Dermatologic: No rashes or ulcers noted.  No cellulitis or open wounds.       Labs No results found for this or any previous visit (from the past 2160 hour(s)).  Radiology No results found.  Assessment/Plan Diabetes mellitus type 2, uncomplicated (HCC) blood glucose control important in reducing the progression of atherosclerotic disease. Also, involved in wound healing. On appropriate medications.   Hyperlipemia, mixed lipid control important in reducing the progression of atherosclerotic disease. Continue statin  therapy   Hypertension blood pressure control important in reducing the progression of atherosclerotic disease. On appropriate oral medications.   Mixed Alzheimer's and vascular dementia ([place]) Fairly significant.  In a memory care setting.  Bilateral leg edema Really no improvement with compression stockings and elevation.  Difficult to discern how much elevation she really does as she is in a memory care unit, staff does try to elevate her as per her daughter.  At this point, a lymphedema pump is likely an excellent adjuvant therapy as she has developed at least stage II lymphedema from chronic scarring and lymphatic channels due to chronic swelling and immobility.  We will try to get that approved at this time.  Going to hold off on any venous intervention for the time being given her age and mental status and reserve this for failure of conservative therapies.  Lymphedema Really no improvement with compression stockings and elevation.  Difficult to discern how much elevation she really does as she is in a memory care unit, staff does try to elevate her as per her daughter.  At this point, a lymphedema pump is likely an excellent adjuvant therapy as she has developed at least stage II lymphedema from chronic scarring and lymphatic channels due to chronic swelling and immobility.  We will try to get that approved at this time.  Going to hold off on any venous intervention for the time being given her age and mental status and reserve this for failure of conservative therapies.  Venous (peripheral) insufficiency Venous reflux study does show reflux in the great saphenous vein in the right leg as well as varicosity of the great saphenous vein in the left leg and the left small saphenous vein.  Not a lot of improvement with compression and elevation thus far.  In discussions with the daughter, given her age and memory status, avoiding any  invasive procedures at this time would be preferred.  I  think starting with a lymphedema pump which would be an excellent adjuvant therapy as she is clearly developed stage II lymphedema in addition to her venous disease would be prudent.  We will try to get that obtained and then reassess her in a few months.    Leotis Pain, MD  07/20/2020 12:23 PM    This note was created with Dragon medical transcription system.  Any errors from dictation are purely unintentional

## 2020-07-20 NOTE — Assessment & Plan Note (Signed)
Really no improvement with compression stockings and elevation.  Difficult to discern how much elevation she really does as she is in a memory care unit, staff does try to elevate her as per her daughter.  At this point, a lymphedema pump is likely an excellent adjuvant therapy as she has developed at least stage II lymphedema from chronic scarring and lymphatic channels due to chronic swelling and immobility.  We will try to get that approved at this time.  Going to hold off on any venous intervention for the time being given her age and mental status and reserve this for failure of conservative therapies.

## 2020-07-20 NOTE — Assessment & Plan Note (Signed)
Venous reflux study does show reflux in the great saphenous vein in the right leg as well as varicosity of the great saphenous vein in the left leg and the left small saphenous vein.  Not a lot of improvement with compression and elevation thus far.  In discussions with the daughter, given her age and memory status, avoiding any invasive procedures at this time would be preferred.  I think starting with a lymphedema pump which would be an excellent adjuvant therapy as she is clearly developed stage II lymphedema in addition to her venous disease would be prudent.  We will try to get that obtained and then reassess her in a few months.

## 2020-08-10 ENCOUNTER — Telehealth (INDEPENDENT_AMBULATORY_CARE_PROVIDER_SITE_OTHER): Payer: Self-pay

## 2020-08-10 NOTE — Telephone Encounter (Signed)
This was received from [facility] at [employer]:  Patient MRN [id number]: The patients sons just called saying that he and his sister, the patients daughter, take care of their mother and that they would be the contacts. He said that they do not feel confident in the facilities that their mother is in and want to wait to get her the equipment. We explained what the pump was and they said they did not want to have [place] come out yet. He said maybe give a call back in a few months if the people where their mother is changes but they do not feel that she would be able to, or anyone at the facility would help her use the equipment, and that they appreciate the call, then he hung up.

## 2020-09-10 DIAGNOSIS — C73 Malignant neoplasm of thyroid gland: Secondary | ICD-10-CM | POA: Diagnosis not present

## 2020-09-10 DIAGNOSIS — Z20828 Contact with and (suspected) exposure to other viral communicable diseases: Secondary | ICD-10-CM | POA: Diagnosis not present

## 2020-09-10 DIAGNOSIS — U071 COVID-19: Secondary | ICD-10-CM | POA: Diagnosis not present

## 2020-09-10 DIAGNOSIS — I10 Essential (primary) hypertension: Secondary | ICD-10-CM | POA: Diagnosis not present

## 2020-09-10 DIAGNOSIS — E1165 Type 2 diabetes mellitus with hyperglycemia: Secondary | ICD-10-CM | POA: Diagnosis not present

## 2020-09-10 DIAGNOSIS — F0280 Dementia in other diseases classified elsewhere without behavioral disturbance: Secondary | ICD-10-CM | POA: Diagnosis not present

## 2020-09-10 DIAGNOSIS — G4700 Insomnia, unspecified: Secondary | ICD-10-CM | POA: Diagnosis not present

## 2020-09-10 DIAGNOSIS — E039 Hypothyroidism, unspecified: Secondary | ICD-10-CM | POA: Diagnosis not present

## 2020-09-10 DIAGNOSIS — G309 Alzheimer's disease, unspecified: Secondary | ICD-10-CM | POA: Diagnosis not present

## 2020-09-10 DIAGNOSIS — F0150 Vascular dementia without behavioral disturbance: Secondary | ICD-10-CM | POA: Diagnosis not present

## 2020-09-10 DIAGNOSIS — R519 Headache, unspecified: Secondary | ICD-10-CM | POA: Diagnosis not present

## 2020-09-17 DIAGNOSIS — U071 COVID-19: Secondary | ICD-10-CM | POA: Diagnosis not present

## 2020-09-17 DIAGNOSIS — Z20828 Contact with and (suspected) exposure to other viral communicable diseases: Secondary | ICD-10-CM | POA: Diagnosis not present

## 2020-10-01 DIAGNOSIS — R410 Disorientation, unspecified: Secondary | ICD-10-CM | POA: Diagnosis not present

## 2020-10-01 DIAGNOSIS — Z87891 Personal history of nicotine dependence: Secondary | ICD-10-CM | POA: Diagnosis not present

## 2020-10-01 DIAGNOSIS — R930 Abnormal findings on diagnostic imaging of skull and head, not elsewhere classified: Secondary | ICD-10-CM | POA: Diagnosis not present

## 2020-10-01 DIAGNOSIS — M170 Bilateral primary osteoarthritis of knee: Secondary | ICD-10-CM | POA: Diagnosis not present

## 2020-10-01 DIAGNOSIS — M25531 Pain in right wrist: Secondary | ICD-10-CM | POA: Diagnosis not present

## 2020-10-01 DIAGNOSIS — J984 Other disorders of lung: Secondary | ICD-10-CM | POA: Diagnosis not present

## 2020-10-01 DIAGNOSIS — Z7982 Long term (current) use of aspirin: Secondary | ICD-10-CM | POA: Diagnosis not present

## 2020-10-01 DIAGNOSIS — M25552 Pain in left hip: Secondary | ICD-10-CM | POA: Diagnosis not present

## 2020-10-01 DIAGNOSIS — H9190 Unspecified hearing loss, unspecified ear: Secondary | ICD-10-CM | POA: Diagnosis not present

## 2020-10-01 DIAGNOSIS — F0390 Unspecified dementia without behavioral disturbance: Secondary | ICD-10-CM | POA: Diagnosis not present

## 2020-10-01 DIAGNOSIS — E7800 Pure hypercholesterolemia, unspecified: Secondary | ICD-10-CM | POA: Diagnosis not present

## 2020-10-01 DIAGNOSIS — I878 Other specified disorders of veins: Secondary | ICD-10-CM | POA: Diagnosis not present

## 2020-10-01 DIAGNOSIS — F0280 Dementia in other diseases classified elsewhere without behavioral disturbance: Secondary | ICD-10-CM | POA: Diagnosis not present

## 2020-10-01 DIAGNOSIS — H538 Other visual disturbances: Secondary | ICD-10-CM | POA: Diagnosis not present

## 2020-10-01 DIAGNOSIS — W19XXXA Unspecified fall, initial encounter: Secondary | ICD-10-CM | POA: Insufficient documentation

## 2020-10-01 DIAGNOSIS — R32 Unspecified urinary incontinence: Secondary | ICD-10-CM | POA: Diagnosis not present

## 2020-10-01 DIAGNOSIS — M79641 Pain in right hand: Secondary | ICD-10-CM | POA: Diagnosis not present

## 2020-10-01 DIAGNOSIS — F0150 Vascular dementia without behavioral disturbance: Secondary | ICD-10-CM | POA: Diagnosis not present

## 2020-10-01 DIAGNOSIS — R262 Difficulty in walking, not elsewhere classified: Secondary | ICD-10-CM | POA: Diagnosis not present

## 2020-10-01 DIAGNOSIS — R634 Abnormal weight loss: Secondary | ICD-10-CM | POA: Diagnosis not present

## 2020-10-01 DIAGNOSIS — G8929 Other chronic pain: Secondary | ICD-10-CM | POA: Diagnosis not present

## 2020-10-01 DIAGNOSIS — Z20828 Contact with and (suspected) exposure to other viral communicable diseases: Secondary | ICD-10-CM | POA: Diagnosis not present

## 2020-10-01 DIAGNOSIS — I10 Essential (primary) hypertension: Secondary | ICD-10-CM | POA: Diagnosis not present

## 2020-10-01 DIAGNOSIS — S52571A Other intraarticular fracture of lower end of right radius, initial encounter for closed fracture: Secondary | ICD-10-CM | POA: Diagnosis not present

## 2020-10-01 DIAGNOSIS — Z6839 Body mass index (BMI) 39.0-39.9, adult: Secondary | ICD-10-CM | POA: Diagnosis not present

## 2020-10-01 DIAGNOSIS — E119 Type 2 diabetes mellitus without complications: Secondary | ICD-10-CM | POA: Diagnosis not present

## 2020-10-01 DIAGNOSIS — R4189 Other symptoms and signs involving cognitive functions and awareness: Secondary | ICD-10-CM | POA: Diagnosis not present

## 2020-10-01 DIAGNOSIS — E039 Hypothyroidism, unspecified: Secondary | ICD-10-CM | POA: Diagnosis not present

## 2020-10-01 DIAGNOSIS — R296 Repeated falls: Secondary | ICD-10-CM | POA: Diagnosis not present

## 2020-10-01 DIAGNOSIS — G479 Sleep disorder, unspecified: Secondary | ICD-10-CM | POA: Diagnosis not present

## 2020-10-01 DIAGNOSIS — Z043 Encounter for examination and observation following other accident: Secondary | ICD-10-CM | POA: Diagnosis not present

## 2020-10-01 DIAGNOSIS — U071 COVID-19: Secondary | ICD-10-CM | POA: Diagnosis not present

## 2020-10-01 DIAGNOSIS — I517 Cardiomegaly: Secondary | ICD-10-CM | POA: Diagnosis not present

## 2020-10-01 DIAGNOSIS — R600 Localized edema: Secondary | ICD-10-CM | POA: Diagnosis not present

## 2020-10-01 DIAGNOSIS — Z20822 Contact with and (suspected) exposure to covid-19: Secondary | ICD-10-CM | POA: Diagnosis not present

## 2020-10-01 DIAGNOSIS — Z7984 Long term (current) use of oral hypoglycemic drugs: Secondary | ICD-10-CM | POA: Diagnosis not present

## 2020-10-01 DIAGNOSIS — G309 Alzheimer's disease, unspecified: Secondary | ICD-10-CM | POA: Diagnosis not present

## 2020-10-01 DIAGNOSIS — M19041 Primary osteoarthritis, right hand: Secondary | ICD-10-CM | POA: Diagnosis not present

## 2020-10-02 DIAGNOSIS — G309 Alzheimer's disease, unspecified: Secondary | ICD-10-CM | POA: Diagnosis not present

## 2020-10-02 DIAGNOSIS — F0280 Dementia in other diseases classified elsewhere without behavioral disturbance: Secondary | ICD-10-CM | POA: Diagnosis not present

## 2020-10-02 DIAGNOSIS — R262 Difficulty in walking, not elsewhere classified: Secondary | ICD-10-CM | POA: Diagnosis not present

## 2020-10-02 DIAGNOSIS — R410 Disorientation, unspecified: Secondary | ICD-10-CM | POA: Diagnosis not present

## 2020-10-02 DIAGNOSIS — G8929 Other chronic pain: Secondary | ICD-10-CM | POA: Insufficient documentation

## 2020-10-02 DIAGNOSIS — I10 Essential (primary) hypertension: Secondary | ICD-10-CM | POA: Diagnosis not present

## 2020-10-02 DIAGNOSIS — S52571A Other intraarticular fracture of lower end of right radius, initial encounter for closed fracture: Secondary | ICD-10-CM | POA: Diagnosis not present

## 2020-10-03 DIAGNOSIS — I10 Essential (primary) hypertension: Secondary | ICD-10-CM | POA: Diagnosis not present

## 2020-10-03 DIAGNOSIS — S52501D Unspecified fracture of the lower end of right radius, subsequent encounter for closed fracture with routine healing: Secondary | ICD-10-CM | POA: Diagnosis not present

## 2020-10-03 DIAGNOSIS — G309 Alzheimer's disease, unspecified: Secondary | ICD-10-CM | POA: Diagnosis not present

## 2020-10-03 DIAGNOSIS — S52571A Other intraarticular fracture of lower end of right radius, initial encounter for closed fracture: Secondary | ICD-10-CM | POA: Diagnosis not present

## 2020-10-03 DIAGNOSIS — M170 Bilateral primary osteoarthritis of knee: Secondary | ICD-10-CM | POA: Diagnosis not present

## 2020-10-03 DIAGNOSIS — F0280 Dementia in other diseases classified elsewhere without behavioral disturbance: Secondary | ICD-10-CM | POA: Diagnosis not present

## 2020-10-03 DIAGNOSIS — E119 Type 2 diabetes mellitus without complications: Secondary | ICD-10-CM | POA: Diagnosis not present

## 2020-10-03 DIAGNOSIS — R410 Disorientation, unspecified: Secondary | ICD-10-CM | POA: Diagnosis not present

## 2020-10-04 DIAGNOSIS — F0280 Dementia in other diseases classified elsewhere without behavioral disturbance: Secondary | ICD-10-CM | POA: Diagnosis not present

## 2020-10-04 DIAGNOSIS — F0281 Dementia in other diseases classified elsewhere with behavioral disturbance: Secondary | ICD-10-CM | POA: Diagnosis not present

## 2020-10-04 DIAGNOSIS — W19XXXA Unspecified fall, initial encounter: Secondary | ICD-10-CM | POA: Diagnosis not present

## 2020-10-04 DIAGNOSIS — G309 Alzheimer's disease, unspecified: Secondary | ICD-10-CM | POA: Diagnosis not present

## 2020-10-04 DIAGNOSIS — I10 Essential (primary) hypertension: Secondary | ICD-10-CM | POA: Diagnosis not present

## 2020-10-04 DIAGNOSIS — R410 Disorientation, unspecified: Secondary | ICD-10-CM | POA: Diagnosis not present

## 2020-10-04 DIAGNOSIS — S52571A Other intraarticular fracture of lower end of right radius, initial encounter for closed fracture: Secondary | ICD-10-CM | POA: Diagnosis not present

## 2020-10-04 DIAGNOSIS — R2689 Other abnormalities of gait and mobility: Secondary | ICD-10-CM | POA: Diagnosis not present

## 2020-10-04 DIAGNOSIS — G8929 Other chronic pain: Secondary | ICD-10-CM | POA: Diagnosis not present

## 2020-10-05 DIAGNOSIS — F0280 Dementia in other diseases classified elsewhere without behavioral disturbance: Secondary | ICD-10-CM | POA: Diagnosis not present

## 2020-10-05 DIAGNOSIS — M109 Gout, unspecified: Secondary | ICD-10-CM | POA: Diagnosis not present

## 2020-10-05 DIAGNOSIS — E039 Hypothyroidism, unspecified: Secondary | ICD-10-CM | POA: Diagnosis not present

## 2020-10-05 DIAGNOSIS — R296 Repeated falls: Secondary | ICD-10-CM | POA: Diagnosis not present

## 2020-10-05 DIAGNOSIS — E7800 Pure hypercholesterolemia, unspecified: Secondary | ICD-10-CM | POA: Diagnosis not present

## 2020-10-05 DIAGNOSIS — F0150 Vascular dementia without behavioral disturbance: Secondary | ICD-10-CM | POA: Diagnosis not present

## 2020-10-05 DIAGNOSIS — S52571D Other intraarticular fracture of lower end of right radius, subsequent encounter for closed fracture with routine healing: Secondary | ICD-10-CM | POA: Diagnosis not present

## 2020-10-05 DIAGNOSIS — E118 Type 2 diabetes mellitus with unspecified complications: Secondary | ICD-10-CM | POA: Diagnosis not present

## 2020-10-05 DIAGNOSIS — U071 COVID-19: Secondary | ICD-10-CM | POA: Diagnosis not present

## 2020-10-05 DIAGNOSIS — E119 Type 2 diabetes mellitus without complications: Secondary | ICD-10-CM | POA: Diagnosis not present

## 2020-10-05 DIAGNOSIS — R410 Disorientation, unspecified: Secondary | ICD-10-CM | POA: Diagnosis not present

## 2020-10-05 DIAGNOSIS — G8929 Other chronic pain: Secondary | ICD-10-CM | POA: Diagnosis not present

## 2020-10-05 DIAGNOSIS — F325 Major depressive disorder, single episode, in full remission: Secondary | ICD-10-CM | POA: Diagnosis not present

## 2020-10-05 DIAGNOSIS — Z7984 Long term (current) use of oral hypoglycemic drugs: Secondary | ICD-10-CM | POA: Diagnosis not present

## 2020-10-05 DIAGNOSIS — G309 Alzheimer's disease, unspecified: Secondary | ICD-10-CM | POA: Diagnosis not present

## 2020-10-05 DIAGNOSIS — Z20828 Contact with and (suspected) exposure to other viral communicable diseases: Secondary | ICD-10-CM | POA: Diagnosis not present

## 2020-10-05 DIAGNOSIS — M6281 Muscle weakness (generalized): Secondary | ICD-10-CM | POA: Diagnosis not present

## 2020-10-05 DIAGNOSIS — W19XXXD Unspecified fall, subsequent encounter: Secondary | ICD-10-CM | POA: Diagnosis not present

## 2020-10-05 DIAGNOSIS — R030 Elevated blood-pressure reading, without diagnosis of hypertension: Secondary | ICD-10-CM | POA: Diagnosis not present

## 2020-10-05 DIAGNOSIS — Z87891 Personal history of nicotine dependence: Secondary | ICD-10-CM | POA: Diagnosis not present

## 2020-10-05 DIAGNOSIS — R5381 Other malaise: Secondary | ICD-10-CM | POA: Diagnosis not present

## 2020-10-05 DIAGNOSIS — I10 Essential (primary) hypertension: Secondary | ICD-10-CM | POA: Diagnosis not present

## 2020-10-05 DIAGNOSIS — M4800 Spinal stenosis, site unspecified: Secondary | ICD-10-CM | POA: Diagnosis not present

## 2020-10-05 DIAGNOSIS — D649 Anemia, unspecified: Secondary | ICD-10-CM | POA: Diagnosis not present

## 2020-10-05 DIAGNOSIS — M1990 Unspecified osteoarthritis, unspecified site: Secondary | ICD-10-CM | POA: Diagnosis not present

## 2020-10-05 DIAGNOSIS — Z8673 Personal history of transient ischemic attack (TIA), and cerebral infarction without residual deficits: Secondary | ICD-10-CM | POA: Diagnosis not present

## 2020-10-05 DIAGNOSIS — R279 Unspecified lack of coordination: Secondary | ICD-10-CM | POA: Diagnosis not present

## 2020-10-05 DIAGNOSIS — F0390 Unspecified dementia without behavioral disturbance: Secondary | ICD-10-CM | POA: Diagnosis not present

## 2020-10-05 DIAGNOSIS — E1140 Type 2 diabetes mellitus with diabetic neuropathy, unspecified: Secondary | ICD-10-CM | POA: Diagnosis not present

## 2020-10-05 DIAGNOSIS — S52571A Other intraarticular fracture of lower end of right radius, initial encounter for closed fracture: Secondary | ICD-10-CM | POA: Diagnosis not present

## 2020-10-05 DIAGNOSIS — E785 Hyperlipidemia, unspecified: Secondary | ICD-10-CM | POA: Diagnosis not present

## 2020-10-05 DIAGNOSIS — Z7982 Long term (current) use of aspirin: Secondary | ICD-10-CM | POA: Diagnosis not present

## 2020-10-05 DIAGNOSIS — Z79899 Other long term (current) drug therapy: Secondary | ICD-10-CM | POA: Diagnosis not present

## 2020-10-05 DIAGNOSIS — R0989 Other specified symptoms and signs involving the circulatory and respiratory systems: Secondary | ICD-10-CM | POA: Diagnosis not present

## 2020-10-05 DIAGNOSIS — S52501A Unspecified fracture of the lower end of right radius, initial encounter for closed fracture: Secondary | ICD-10-CM | POA: Insufficient documentation

## 2020-10-08 DIAGNOSIS — F0390 Unspecified dementia without behavioral disturbance: Secondary | ICD-10-CM | POA: Diagnosis not present

## 2020-10-08 DIAGNOSIS — Z79899 Other long term (current) drug therapy: Secondary | ICD-10-CM | POA: Diagnosis not present

## 2020-10-08 DIAGNOSIS — F325 Major depressive disorder, single episode, in full remission: Secondary | ICD-10-CM | POA: Diagnosis not present

## 2020-10-08 DIAGNOSIS — I10 Essential (primary) hypertension: Secondary | ICD-10-CM | POA: Diagnosis not present

## 2020-10-08 DIAGNOSIS — E118 Type 2 diabetes mellitus with unspecified complications: Secondary | ICD-10-CM | POA: Diagnosis not present

## 2020-10-08 DIAGNOSIS — R296 Repeated falls: Secondary | ICD-10-CM | POA: Diagnosis not present

## 2020-10-09 DIAGNOSIS — S52501A Unspecified fracture of the lower end of right radius, initial encounter for closed fracture: Secondary | ICD-10-CM | POA: Diagnosis not present

## 2020-10-09 DIAGNOSIS — S52571A Other intraarticular fracture of lower end of right radius, initial encounter for closed fracture: Secondary | ICD-10-CM | POA: Diagnosis not present

## 2020-10-22 DIAGNOSIS — Z20828 Contact with and (suspected) exposure to other viral communicable diseases: Secondary | ICD-10-CM | POA: Diagnosis not present

## 2020-10-22 DIAGNOSIS — U071 COVID-19: Secondary | ICD-10-CM | POA: Diagnosis not present

## 2020-10-23 ENCOUNTER — Ambulatory Visit (INDEPENDENT_AMBULATORY_CARE_PROVIDER_SITE_OTHER): Payer: PPO | Admitting: Nurse Practitioner

## 2020-10-29 DIAGNOSIS — Z20828 Contact with and (suspected) exposure to other viral communicable diseases: Secondary | ICD-10-CM | POA: Diagnosis not present

## 2020-10-29 DIAGNOSIS — U071 COVID-19: Secondary | ICD-10-CM | POA: Diagnosis not present

## 2020-10-30 DIAGNOSIS — S52501D Unspecified fracture of the lower end of right radius, subsequent encounter for closed fracture with routine healing: Secondary | ICD-10-CM | POA: Diagnosis not present

## 2020-10-30 DIAGNOSIS — S52571D Other intraarticular fracture of lower end of right radius, subsequent encounter for closed fracture with routine healing: Secondary | ICD-10-CM | POA: Diagnosis not present

## 2020-10-30 DIAGNOSIS — S52571A Other intraarticular fracture of lower end of right radius, initial encounter for closed fracture: Secondary | ICD-10-CM | POA: Diagnosis not present

## 2020-10-30 DIAGNOSIS — S6291XD Unspecified fracture of right wrist and hand, subsequent encounter for fracture with routine healing: Secondary | ICD-10-CM | POA: Diagnosis not present

## 2020-11-01 DIAGNOSIS — M6281 Muscle weakness (generalized): Secondary | ICD-10-CM | POA: Diagnosis not present

## 2020-11-01 DIAGNOSIS — R2689 Other abnormalities of gait and mobility: Secondary | ICD-10-CM | POA: Diagnosis not present

## 2020-11-05 DIAGNOSIS — U071 COVID-19: Secondary | ICD-10-CM | POA: Diagnosis not present

## 2020-11-05 DIAGNOSIS — Z20828 Contact with and (suspected) exposure to other viral communicable diseases: Secondary | ICD-10-CM | POA: Diagnosis not present

## 2020-11-07 DIAGNOSIS — F0390 Unspecified dementia without behavioral disturbance: Secondary | ICD-10-CM | POA: Diagnosis not present

## 2020-11-07 DIAGNOSIS — U071 COVID-19: Secondary | ICD-10-CM | POA: Diagnosis not present

## 2020-11-09 DIAGNOSIS — M6281 Muscle weakness (generalized): Secondary | ICD-10-CM | POA: Diagnosis not present

## 2020-11-09 DIAGNOSIS — R2689 Other abnormalities of gait and mobility: Secondary | ICD-10-CM | POA: Diagnosis not present

## 2020-11-12 DIAGNOSIS — U071 COVID-19: Secondary | ICD-10-CM | POA: Diagnosis not present

## 2020-11-12 DIAGNOSIS — Z20828 Contact with and (suspected) exposure to other viral communicable diseases: Secondary | ICD-10-CM | POA: Diagnosis not present

## 2020-11-13 DIAGNOSIS — Z8701 Personal history of pneumonia (recurrent): Secondary | ICD-10-CM | POA: Diagnosis not present

## 2020-11-13 DIAGNOSIS — Z8616 Personal history of COVID-19: Secondary | ICD-10-CM | POA: Diagnosis not present

## 2020-11-13 DIAGNOSIS — Z09 Encounter for follow-up examination after completed treatment for conditions other than malignant neoplasm: Secondary | ICD-10-CM | POA: Diagnosis not present

## 2020-11-15 DIAGNOSIS — C73 Malignant neoplasm of thyroid gland: Secondary | ICD-10-CM | POA: Diagnosis not present

## 2020-11-15 DIAGNOSIS — E039 Hypothyroidism, unspecified: Secondary | ICD-10-CM | POA: Diagnosis not present

## 2020-11-15 DIAGNOSIS — F411 Generalized anxiety disorder: Secondary | ICD-10-CM | POA: Diagnosis not present

## 2020-11-15 DIAGNOSIS — F0281 Dementia in other diseases classified elsewhere with behavioral disturbance: Secondary | ICD-10-CM | POA: Diagnosis not present

## 2020-11-15 DIAGNOSIS — I10 Essential (primary) hypertension: Secondary | ICD-10-CM | POA: Diagnosis not present

## 2020-11-15 DIAGNOSIS — G4700 Insomnia, unspecified: Secondary | ICD-10-CM | POA: Diagnosis not present

## 2020-11-15 DIAGNOSIS — R451 Restlessness and agitation: Secondary | ICD-10-CM | POA: Diagnosis not present

## 2020-11-15 DIAGNOSIS — G301 Alzheimer's disease with late onset: Secondary | ICD-10-CM | POA: Diagnosis not present

## 2020-11-19 DIAGNOSIS — U071 COVID-19: Secondary | ICD-10-CM | POA: Diagnosis not present

## 2020-11-19 DIAGNOSIS — Z20828 Contact with and (suspected) exposure to other viral communicable diseases: Secondary | ICD-10-CM | POA: Diagnosis not present

## 2020-11-23 DIAGNOSIS — L03119 Cellulitis of unspecified part of limb: Secondary | ICD-10-CM | POA: Diagnosis not present

## 2020-11-23 DIAGNOSIS — G4700 Insomnia, unspecified: Secondary | ICD-10-CM | POA: Diagnosis not present

## 2020-11-23 DIAGNOSIS — R4182 Altered mental status, unspecified: Secondary | ICD-10-CM | POA: Diagnosis not present

## 2020-11-23 DIAGNOSIS — R600 Localized edema: Secondary | ICD-10-CM | POA: Diagnosis not present

## 2020-11-23 DIAGNOSIS — F0281 Dementia in other diseases classified elsewhere with behavioral disturbance: Secondary | ICD-10-CM | POA: Diagnosis not present

## 2020-11-23 DIAGNOSIS — E1165 Type 2 diabetes mellitus with hyperglycemia: Secondary | ICD-10-CM | POA: Diagnosis not present

## 2020-11-23 DIAGNOSIS — G301 Alzheimer's disease with late onset: Secondary | ICD-10-CM | POA: Diagnosis not present

## 2020-12-03 DIAGNOSIS — Z20828 Contact with and (suspected) exposure to other viral communicable diseases: Secondary | ICD-10-CM | POA: Diagnosis not present

## 2020-12-03 DIAGNOSIS — U071 COVID-19: Secondary | ICD-10-CM | POA: Diagnosis not present

## 2021-01-04 DIAGNOSIS — I509 Heart failure, unspecified: Secondary | ICD-10-CM | POA: Diagnosis not present

## 2021-01-04 DIAGNOSIS — E1140 Type 2 diabetes mellitus with diabetic neuropathy, unspecified: Secondary | ICD-10-CM | POA: Diagnosis not present

## 2021-01-04 DIAGNOSIS — F0391 Unspecified dementia with behavioral disturbance: Secondary | ICD-10-CM | POA: Diagnosis not present

## 2021-01-04 DIAGNOSIS — Z7984 Long term (current) use of oral hypoglycemic drugs: Secondary | ICD-10-CM | POA: Diagnosis not present

## 2021-01-04 DIAGNOSIS — E1151 Type 2 diabetes mellitus with diabetic peripheral angiopathy without gangrene: Secondary | ICD-10-CM | POA: Diagnosis not present

## 2021-01-04 DIAGNOSIS — Z9183 Wandering in diseases classified elsewhere: Secondary | ICD-10-CM | POA: Diagnosis not present

## 2021-01-04 DIAGNOSIS — E261 Secondary hyperaldosteronism: Secondary | ICD-10-CM | POA: Diagnosis not present

## 2021-01-07 DIAGNOSIS — I10 Essential (primary) hypertension: Secondary | ICD-10-CM | POA: Diagnosis not present

## 2021-01-07 DIAGNOSIS — F0280 Dementia in other diseases classified elsewhere without behavioral disturbance: Secondary | ICD-10-CM | POA: Diagnosis not present

## 2021-01-07 DIAGNOSIS — R8290 Unspecified abnormal findings in urine: Secondary | ICD-10-CM | POA: Diagnosis not present

## 2021-01-07 DIAGNOSIS — G4700 Insomnia, unspecified: Secondary | ICD-10-CM | POA: Diagnosis not present

## 2021-01-07 DIAGNOSIS — C73 Malignant neoplasm of thyroid gland: Secondary | ICD-10-CM | POA: Diagnosis not present

## 2021-01-07 DIAGNOSIS — F0150 Vascular dementia without behavioral disturbance: Secondary | ICD-10-CM | POA: Diagnosis not present

## 2021-01-07 DIAGNOSIS — E1165 Type 2 diabetes mellitus with hyperglycemia: Secondary | ICD-10-CM | POA: Diagnosis not present

## 2021-01-07 DIAGNOSIS — E039 Hypothyroidism, unspecified: Secondary | ICD-10-CM | POA: Diagnosis not present

## 2021-01-07 DIAGNOSIS — G309 Alzheimer's disease, unspecified: Secondary | ICD-10-CM | POA: Diagnosis not present

## 2021-01-14 DIAGNOSIS — Z6830 Body mass index (BMI) 30.0-30.9, adult: Secondary | ICD-10-CM | POA: Diagnosis not present

## 2021-01-14 DIAGNOSIS — E039 Hypothyroidism, unspecified: Secondary | ICD-10-CM | POA: Diagnosis not present

## 2021-01-14 DIAGNOSIS — M1711 Unilateral primary osteoarthritis, right knee: Secondary | ICD-10-CM | POA: Diagnosis not present

## 2021-01-14 DIAGNOSIS — E782 Mixed hyperlipidemia: Secondary | ICD-10-CM | POA: Diagnosis not present

## 2021-01-14 DIAGNOSIS — Z0000 Encounter for general adult medical examination without abnormal findings: Secondary | ICD-10-CM | POA: Diagnosis not present

## 2021-01-14 DIAGNOSIS — D649 Anemia, unspecified: Secondary | ICD-10-CM | POA: Diagnosis not present

## 2021-01-14 DIAGNOSIS — F411 Generalized anxiety disorder: Secondary | ICD-10-CM | POA: Diagnosis not present

## 2021-01-14 DIAGNOSIS — F3341 Major depressive disorder, recurrent, in partial remission: Secondary | ICD-10-CM | POA: Insufficient documentation

## 2021-01-14 DIAGNOSIS — N1832 Chronic kidney disease, stage 3b: Secondary | ICD-10-CM | POA: Diagnosis not present

## 2021-01-14 DIAGNOSIS — I10 Essential (primary) hypertension: Secondary | ICD-10-CM | POA: Diagnosis not present

## 2021-01-14 DIAGNOSIS — G309 Alzheimer's disease, unspecified: Secondary | ICD-10-CM | POA: Diagnosis not present

## 2021-01-14 DIAGNOSIS — R600 Localized edema: Secondary | ICD-10-CM | POA: Diagnosis not present

## 2021-01-14 DIAGNOSIS — E1165 Type 2 diabetes mellitus with hyperglycemia: Secondary | ICD-10-CM | POA: Diagnosis not present

## 2021-01-21 DIAGNOSIS — R600 Localized edema: Secondary | ICD-10-CM | POA: Diagnosis not present

## 2021-01-21 DIAGNOSIS — E039 Hypothyroidism, unspecified: Secondary | ICD-10-CM | POA: Diagnosis not present

## 2021-01-21 DIAGNOSIS — E119 Type 2 diabetes mellitus without complications: Secondary | ICD-10-CM | POA: Diagnosis not present

## 2021-01-21 DIAGNOSIS — I10 Essential (primary) hypertension: Secondary | ICD-10-CM | POA: Diagnosis not present

## 2021-01-21 DIAGNOSIS — M1711 Unilateral primary osteoarthritis, right knee: Secondary | ICD-10-CM | POA: Diagnosis not present

## 2021-01-21 DIAGNOSIS — F0151 Vascular dementia with behavioral disturbance: Secondary | ICD-10-CM | POA: Diagnosis not present

## 2021-01-21 DIAGNOSIS — Z7984 Long term (current) use of oral hypoglycemic drugs: Secondary | ICD-10-CM | POA: Diagnosis not present

## 2021-01-21 DIAGNOSIS — M6281 Muscle weakness (generalized): Secondary | ICD-10-CM | POA: Diagnosis not present

## 2021-01-21 DIAGNOSIS — G308 Other Alzheimer's disease: Secondary | ICD-10-CM | POA: Diagnosis not present

## 2021-01-22 DIAGNOSIS — E1165 Type 2 diabetes mellitus with hyperglycemia: Secondary | ICD-10-CM | POA: Diagnosis not present

## 2021-01-22 DIAGNOSIS — F0150 Vascular dementia without behavioral disturbance: Secondary | ICD-10-CM | POA: Diagnosis not present

## 2021-01-22 DIAGNOSIS — M1711 Unilateral primary osteoarthritis, right knee: Secondary | ICD-10-CM | POA: Diagnosis not present

## 2021-01-22 DIAGNOSIS — G309 Alzheimer's disease, unspecified: Secondary | ICD-10-CM | POA: Diagnosis not present

## 2021-01-22 DIAGNOSIS — I10 Essential (primary) hypertension: Secondary | ICD-10-CM | POA: Diagnosis not present

## 2021-01-22 DIAGNOSIS — F3341 Major depressive disorder, recurrent, in partial remission: Secondary | ICD-10-CM | POA: Diagnosis not present

## 2021-01-22 DIAGNOSIS — E782 Mixed hyperlipidemia: Secondary | ICD-10-CM | POA: Diagnosis not present

## 2021-01-22 DIAGNOSIS — N1832 Chronic kidney disease, stage 3b: Secondary | ICD-10-CM | POA: Diagnosis not present

## 2021-01-28 DIAGNOSIS — G308 Other Alzheimer's disease: Secondary | ICD-10-CM | POA: Diagnosis not present

## 2021-01-28 DIAGNOSIS — F0151 Vascular dementia with behavioral disturbance: Secondary | ICD-10-CM | POA: Diagnosis not present

## 2021-01-28 DIAGNOSIS — R600 Localized edema: Secondary | ICD-10-CM | POA: Diagnosis not present

## 2021-01-28 DIAGNOSIS — E119 Type 2 diabetes mellitus without complications: Secondary | ICD-10-CM | POA: Diagnosis not present

## 2021-01-28 DIAGNOSIS — M1711 Unilateral primary osteoarthritis, right knee: Secondary | ICD-10-CM | POA: Diagnosis not present

## 2021-01-28 DIAGNOSIS — I10 Essential (primary) hypertension: Secondary | ICD-10-CM | POA: Diagnosis not present

## 2021-01-28 DIAGNOSIS — M6281 Muscle weakness (generalized): Secondary | ICD-10-CM | POA: Diagnosis not present

## 2021-01-28 DIAGNOSIS — Z7984 Long term (current) use of oral hypoglycemic drugs: Secondary | ICD-10-CM | POA: Diagnosis not present

## 2021-01-28 DIAGNOSIS — E039 Hypothyroidism, unspecified: Secondary | ICD-10-CM | POA: Diagnosis not present

## 2021-01-31 DIAGNOSIS — G308 Other Alzheimer's disease: Secondary | ICD-10-CM | POA: Diagnosis not present

## 2021-01-31 DIAGNOSIS — F0151 Vascular dementia with behavioral disturbance: Secondary | ICD-10-CM | POA: Diagnosis not present

## 2021-01-31 DIAGNOSIS — R600 Localized edema: Secondary | ICD-10-CM | POA: Diagnosis not present

## 2021-01-31 DIAGNOSIS — I10 Essential (primary) hypertension: Secondary | ICD-10-CM | POA: Diagnosis not present

## 2021-01-31 DIAGNOSIS — M1711 Unilateral primary osteoarthritis, right knee: Secondary | ICD-10-CM | POA: Diagnosis not present

## 2021-01-31 DIAGNOSIS — Z7984 Long term (current) use of oral hypoglycemic drugs: Secondary | ICD-10-CM | POA: Diagnosis not present

## 2021-01-31 DIAGNOSIS — E119 Type 2 diabetes mellitus without complications: Secondary | ICD-10-CM | POA: Diagnosis not present

## 2021-02-27 DIAGNOSIS — E119 Type 2 diabetes mellitus without complications: Secondary | ICD-10-CM | POA: Diagnosis not present

## 2021-02-27 DIAGNOSIS — M6281 Muscle weakness (generalized): Secondary | ICD-10-CM | POA: Diagnosis not present

## 2021-02-27 DIAGNOSIS — R600 Localized edema: Secondary | ICD-10-CM | POA: Diagnosis not present

## 2021-02-27 DIAGNOSIS — F0151 Vascular dementia with behavioral disturbance: Secondary | ICD-10-CM | POA: Diagnosis not present

## 2021-02-27 DIAGNOSIS — I10 Essential (primary) hypertension: Secondary | ICD-10-CM | POA: Diagnosis not present

## 2021-02-27 DIAGNOSIS — G308 Other Alzheimer's disease: Secondary | ICD-10-CM | POA: Diagnosis not present

## 2021-02-27 DIAGNOSIS — Z7984 Long term (current) use of oral hypoglycemic drugs: Secondary | ICD-10-CM | POA: Diagnosis not present

## 2021-02-27 DIAGNOSIS — E039 Hypothyroidism, unspecified: Secondary | ICD-10-CM | POA: Diagnosis not present

## 2021-02-27 DIAGNOSIS — M1711 Unilateral primary osteoarthritis, right knee: Secondary | ICD-10-CM | POA: Diagnosis not present

## 2021-03-05 DIAGNOSIS — R300 Dysuria: Secondary | ICD-10-CM | POA: Diagnosis not present

## 2021-05-09 DIAGNOSIS — G309 Alzheimer's disease, unspecified: Secondary | ICD-10-CM | POA: Diagnosis not present

## 2021-05-09 DIAGNOSIS — E039 Hypothyroidism, unspecified: Secondary | ICD-10-CM | POA: Diagnosis not present

## 2021-05-09 DIAGNOSIS — R8290 Unspecified abnormal findings in urine: Secondary | ICD-10-CM | POA: Diagnosis not present

## 2021-05-09 DIAGNOSIS — E1165 Type 2 diabetes mellitus with hyperglycemia: Secondary | ICD-10-CM | POA: Diagnosis not present

## 2021-05-09 DIAGNOSIS — F0280 Dementia in other diseases classified elsewhere without behavioral disturbance: Secondary | ICD-10-CM | POA: Diagnosis not present

## 2021-05-09 DIAGNOSIS — D649 Anemia, unspecified: Secondary | ICD-10-CM | POA: Diagnosis not present

## 2021-05-09 DIAGNOSIS — M1711 Unilateral primary osteoarthritis, right knee: Secondary | ICD-10-CM | POA: Diagnosis not present

## 2021-05-09 DIAGNOSIS — I10 Essential (primary) hypertension: Secondary | ICD-10-CM | POA: Diagnosis not present

## 2021-05-09 DIAGNOSIS — R600 Localized edema: Secondary | ICD-10-CM | POA: Diagnosis not present

## 2021-05-09 DIAGNOSIS — F0150 Vascular dementia without behavioral disturbance: Secondary | ICD-10-CM | POA: Diagnosis not present

## 2021-05-09 DIAGNOSIS — E782 Mixed hyperlipidemia: Secondary | ICD-10-CM | POA: Diagnosis not present

## 2021-05-16 DIAGNOSIS — G309 Alzheimer's disease, unspecified: Secondary | ICD-10-CM | POA: Diagnosis not present

## 2021-05-16 DIAGNOSIS — R600 Localized edema: Secondary | ICD-10-CM | POA: Diagnosis not present

## 2021-05-16 DIAGNOSIS — R262 Difficulty in walking, not elsewhere classified: Secondary | ICD-10-CM | POA: Diagnosis not present

## 2021-05-16 DIAGNOSIS — E1122 Type 2 diabetes mellitus with diabetic chronic kidney disease: Secondary | ICD-10-CM | POA: Diagnosis not present

## 2021-05-16 DIAGNOSIS — F3341 Major depressive disorder, recurrent, in partial remission: Secondary | ICD-10-CM | POA: Diagnosis not present

## 2021-05-16 DIAGNOSIS — Z6832 Body mass index (BMI) 32.0-32.9, adult: Secondary | ICD-10-CM | POA: Diagnosis not present

## 2021-05-16 DIAGNOSIS — M1990 Unspecified osteoarthritis, unspecified site: Secondary | ICD-10-CM | POA: Diagnosis not present

## 2021-05-16 DIAGNOSIS — E039 Hypothyroidism, unspecified: Secondary | ICD-10-CM | POA: Diagnosis not present

## 2021-05-16 DIAGNOSIS — D649 Anemia, unspecified: Secondary | ICD-10-CM | POA: Diagnosis not present

## 2021-05-16 DIAGNOSIS — I10 Essential (primary) hypertension: Secondary | ICD-10-CM | POA: Diagnosis not present

## 2021-05-16 DIAGNOSIS — C73 Malignant neoplasm of thyroid gland: Secondary | ICD-10-CM | POA: Diagnosis not present

## 2021-05-16 DIAGNOSIS — E538 Deficiency of other specified B group vitamins: Secondary | ICD-10-CM | POA: Diagnosis not present

## 2021-05-21 ENCOUNTER — Other Ambulatory Visit: Payer: Self-pay | Admitting: Nephrology

## 2021-05-21 ENCOUNTER — Other Ambulatory Visit (HOSPITAL_COMMUNITY): Payer: Self-pay | Admitting: Nephrology

## 2021-05-21 DIAGNOSIS — D631 Anemia in chronic kidney disease: Secondary | ICD-10-CM | POA: Diagnosis not present

## 2021-05-21 DIAGNOSIS — N189 Chronic kidney disease, unspecified: Secondary | ICD-10-CM | POA: Insufficient documentation

## 2021-05-21 DIAGNOSIS — E1122 Type 2 diabetes mellitus with diabetic chronic kidney disease: Secondary | ICD-10-CM

## 2021-05-21 DIAGNOSIS — N2581 Secondary hyperparathyroidism of renal origin: Secondary | ICD-10-CM | POA: Diagnosis not present

## 2021-05-21 DIAGNOSIS — R809 Proteinuria, unspecified: Secondary | ICD-10-CM | POA: Diagnosis not present

## 2021-05-21 DIAGNOSIS — I509 Heart failure, unspecified: Secondary | ICD-10-CM | POA: Diagnosis not present

## 2021-05-21 DIAGNOSIS — R609 Edema, unspecified: Secondary | ICD-10-CM | POA: Diagnosis not present

## 2021-05-21 DIAGNOSIS — I10 Essential (primary) hypertension: Secondary | ICD-10-CM | POA: Diagnosis not present

## 2021-05-27 ENCOUNTER — Other Ambulatory Visit: Payer: Self-pay

## 2021-05-27 ENCOUNTER — Ambulatory Visit
Admission: RE | Admit: 2021-05-27 | Discharge: 2021-05-27 | Disposition: A | Discharge status: Home / Self Care | Payer: PPO | Source: Ambulatory Visit | Attending: Nephrology | Admitting: Nephrology

## 2021-05-27 DIAGNOSIS — R809 Proteinuria, unspecified: Secondary | ICD-10-CM | POA: Insufficient documentation

## 2021-05-27 DIAGNOSIS — E1122 Type 2 diabetes mellitus with diabetic chronic kidney disease: Secondary | ICD-10-CM | POA: Insufficient documentation

## 2021-05-27 DIAGNOSIS — N261 Atrophy of kidney (terminal): Secondary | ICD-10-CM | POA: Diagnosis not present

## 2021-06-19 DIAGNOSIS — N261 Atrophy of kidney (terminal): Secondary | ICD-10-CM | POA: Insufficient documentation

## 2021-06-19 DIAGNOSIS — N184 Chronic kidney disease, stage 4 (severe): Secondary | ICD-10-CM | POA: Diagnosis not present

## 2021-06-19 DIAGNOSIS — R609 Edema, unspecified: Secondary | ICD-10-CM | POA: Diagnosis not present

## 2021-06-19 DIAGNOSIS — N390 Urinary tract infection, site not specified: Secondary | ICD-10-CM | POA: Diagnosis not present

## 2021-06-19 DIAGNOSIS — R809 Proteinuria, unspecified: Secondary | ICD-10-CM | POA: Diagnosis not present

## 2021-06-19 DIAGNOSIS — D631 Anemia in chronic kidney disease: Secondary | ICD-10-CM | POA: Insufficient documentation

## 2021-06-19 DIAGNOSIS — I10 Essential (primary) hypertension: Secondary | ICD-10-CM | POA: Diagnosis not present

## 2021-06-19 DIAGNOSIS — E119 Type 2 diabetes mellitus without complications: Secondary | ICD-10-CM | POA: Diagnosis not present

## 2021-07-17 DIAGNOSIS — N261 Atrophy of kidney (terminal): Secondary | ICD-10-CM | POA: Diagnosis not present

## 2021-07-17 DIAGNOSIS — I10 Essential (primary) hypertension: Secondary | ICD-10-CM | POA: Diagnosis not present

## 2021-07-17 DIAGNOSIS — N1832 Chronic kidney disease, stage 3b: Secondary | ICD-10-CM | POA: Diagnosis not present

## 2021-07-17 DIAGNOSIS — E119 Type 2 diabetes mellitus without complications: Secondary | ICD-10-CM | POA: Diagnosis not present

## 2021-07-17 DIAGNOSIS — D631 Anemia in chronic kidney disease: Secondary | ICD-10-CM | POA: Diagnosis not present

## 2021-07-17 DIAGNOSIS — R609 Edema, unspecified: Secondary | ICD-10-CM | POA: Diagnosis not present

## 2021-07-17 DIAGNOSIS — R809 Proteinuria, unspecified: Secondary | ICD-10-CM | POA: Diagnosis not present

## 2021-08-26 DIAGNOSIS — I10 Essential (primary) hypertension: Secondary | ICD-10-CM | POA: Diagnosis not present

## 2021-08-26 DIAGNOSIS — E039 Hypothyroidism, unspecified: Secondary | ICD-10-CM | POA: Diagnosis not present

## 2021-08-26 DIAGNOSIS — C73 Malignant neoplasm of thyroid gland: Secondary | ICD-10-CM | POA: Diagnosis not present

## 2021-08-26 DIAGNOSIS — M1990 Unspecified osteoarthritis, unspecified site: Secondary | ICD-10-CM | POA: Diagnosis not present

## 2021-08-26 DIAGNOSIS — R262 Difficulty in walking, not elsewhere classified: Secondary | ICD-10-CM | POA: Diagnosis not present

## 2021-08-26 DIAGNOSIS — F3341 Major depressive disorder, recurrent, in partial remission: Secondary | ICD-10-CM | POA: Diagnosis not present

## 2021-08-26 DIAGNOSIS — N184 Chronic kidney disease, stage 4 (severe): Secondary | ICD-10-CM | POA: Diagnosis not present

## 2021-08-26 DIAGNOSIS — R600 Localized edema: Secondary | ICD-10-CM | POA: Diagnosis not present

## 2021-08-26 DIAGNOSIS — F0150 Vascular dementia without behavioral disturbance: Secondary | ICD-10-CM | POA: Diagnosis not present

## 2021-08-26 DIAGNOSIS — G309 Alzheimer's disease, unspecified: Secondary | ICD-10-CM | POA: Diagnosis not present

## 2021-08-26 DIAGNOSIS — Z6832 Body mass index (BMI) 32.0-32.9, adult: Secondary | ICD-10-CM | POA: Diagnosis not present

## 2021-08-26 DIAGNOSIS — E1122 Type 2 diabetes mellitus with diabetic chronic kidney disease: Secondary | ICD-10-CM | POA: Diagnosis not present

## 2021-08-27 DIAGNOSIS — R8290 Unspecified abnormal findings in urine: Secondary | ICD-10-CM | POA: Diagnosis not present

## 2021-08-30 DIAGNOSIS — N184 Chronic kidney disease, stage 4 (severe): Secondary | ICD-10-CM | POA: Diagnosis not present

## 2021-08-30 DIAGNOSIS — F03911 Unspecified dementia, unspecified severity, with agitation: Secondary | ICD-10-CM | POA: Diagnosis not present

## 2021-08-30 DIAGNOSIS — I739 Peripheral vascular disease, unspecified: Secondary | ICD-10-CM | POA: Diagnosis not present

## 2021-08-30 DIAGNOSIS — I129 Hypertensive chronic kidney disease with stage 1 through stage 4 chronic kidney disease, or unspecified chronic kidney disease: Secondary | ICD-10-CM | POA: Diagnosis not present

## 2021-08-30 DIAGNOSIS — Z9183 Wandering in diseases classified elsewhere: Secondary | ICD-10-CM | POA: Diagnosis not present

## 2021-08-30 DIAGNOSIS — F03918 Unspecified dementia, unspecified severity, with other behavioral disturbance: Secondary | ICD-10-CM | POA: Diagnosis not present

## 2021-09-02 DIAGNOSIS — E039 Hypothyroidism, unspecified: Secondary | ICD-10-CM | POA: Diagnosis not present

## 2021-09-02 DIAGNOSIS — R600 Localized edema: Secondary | ICD-10-CM | POA: Diagnosis not present

## 2021-09-02 DIAGNOSIS — C73 Malignant neoplasm of thyroid gland: Secondary | ICD-10-CM | POA: Diagnosis not present

## 2021-09-02 DIAGNOSIS — G309 Alzheimer's disease, unspecified: Secondary | ICD-10-CM | POA: Diagnosis not present

## 2021-09-02 DIAGNOSIS — I10 Essential (primary) hypertension: Secondary | ICD-10-CM | POA: Diagnosis not present

## 2021-09-02 DIAGNOSIS — E1122 Type 2 diabetes mellitus with diabetic chronic kidney disease: Secondary | ICD-10-CM | POA: Diagnosis not present

## 2021-09-02 DIAGNOSIS — B9620 Unspecified Escherichia coli [E. coli] as the cause of diseases classified elsewhere: Secondary | ICD-10-CM | POA: Diagnosis not present

## 2021-09-02 DIAGNOSIS — F0150 Vascular dementia without behavioral disturbance: Secondary | ICD-10-CM | POA: Diagnosis not present

## 2021-09-02 DIAGNOSIS — N390 Urinary tract infection, site not specified: Secondary | ICD-10-CM | POA: Diagnosis not present

## 2021-09-02 DIAGNOSIS — N184 Chronic kidney disease, stage 4 (severe): Secondary | ICD-10-CM | POA: Diagnosis not present

## 2021-09-02 DIAGNOSIS — F0280 Dementia in other diseases classified elsewhere without behavioral disturbance: Secondary | ICD-10-CM | POA: Diagnosis not present

## 2021-09-02 IMAGING — US US RENAL
1 series · 14 of 23 positions shown · non-contrast
Comparison: None.

CLINICAL DATA: proteinuria

EXAM:
RENAL / URINARY TRACT ULTRASOUND COMPLETE

[Series 1: us renal · 0.23mm/px · 14 of 23 slices shown]
[im 1/23]
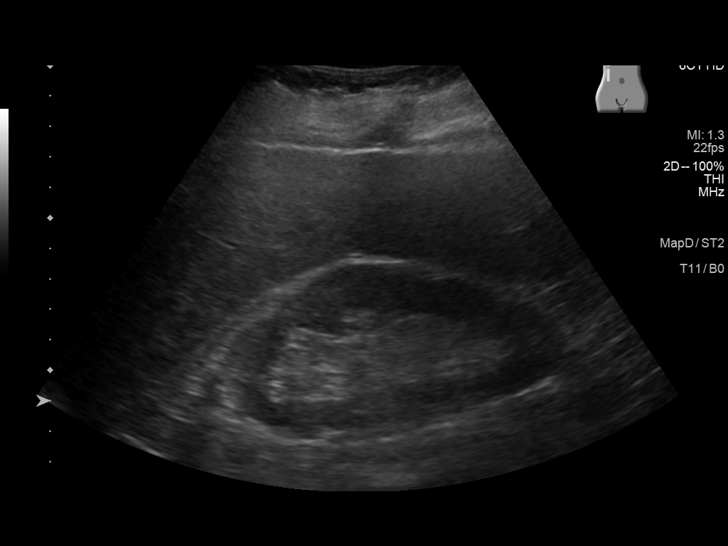
[im 3/23]
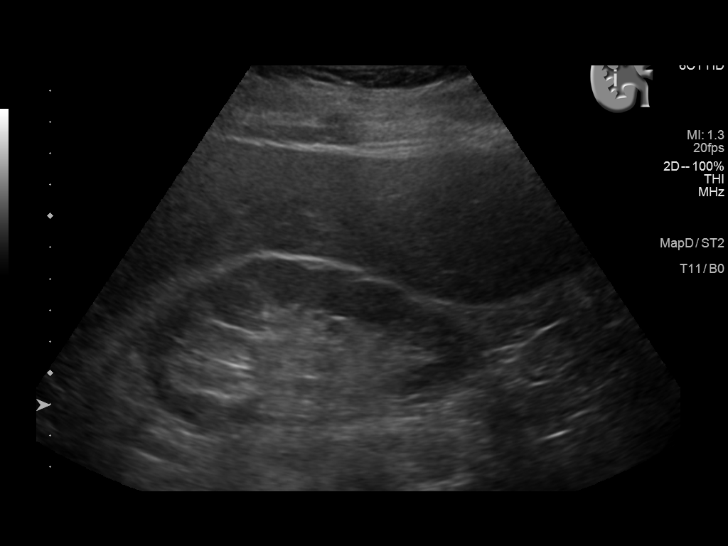
[im 5/23]
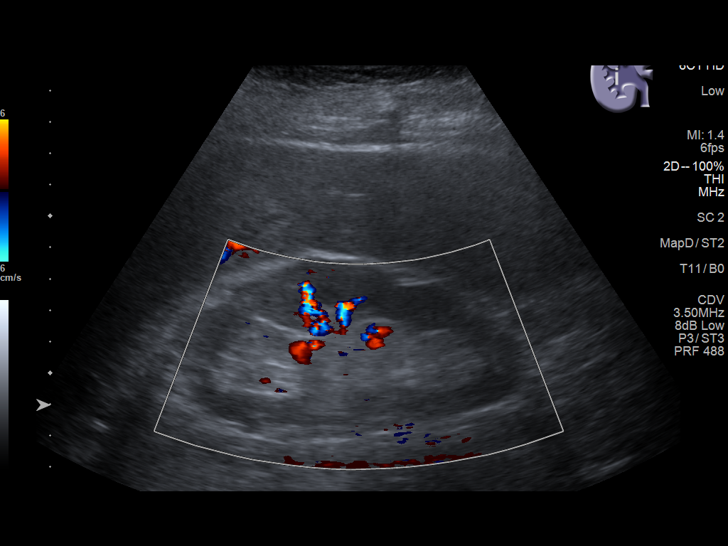
[im 6/23]
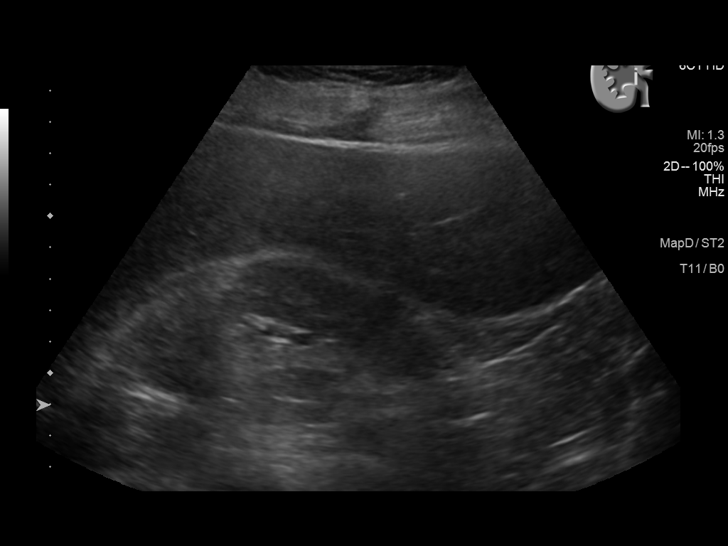
[im 8/23]
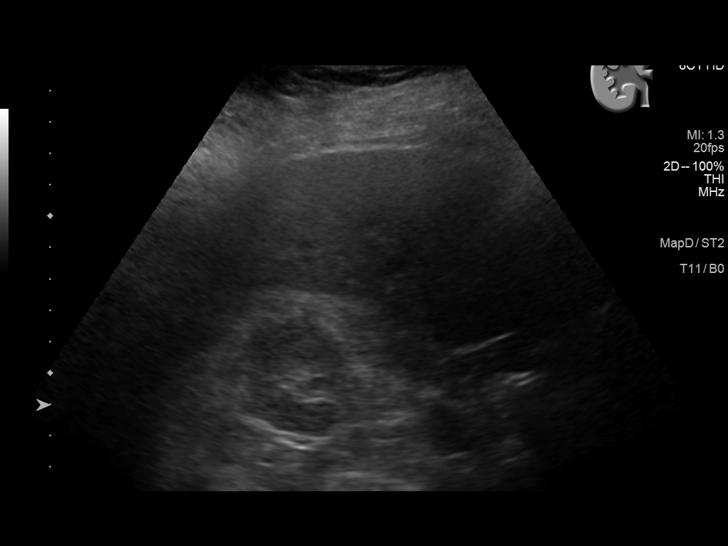
[im 10/23]
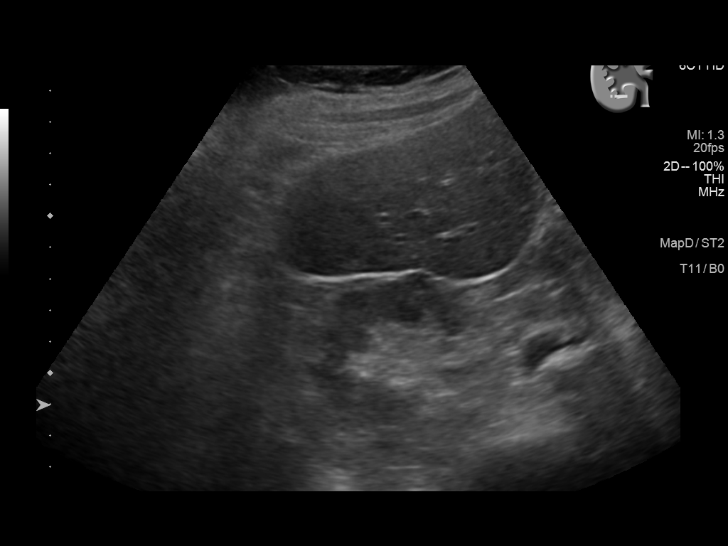
[im 11/23]
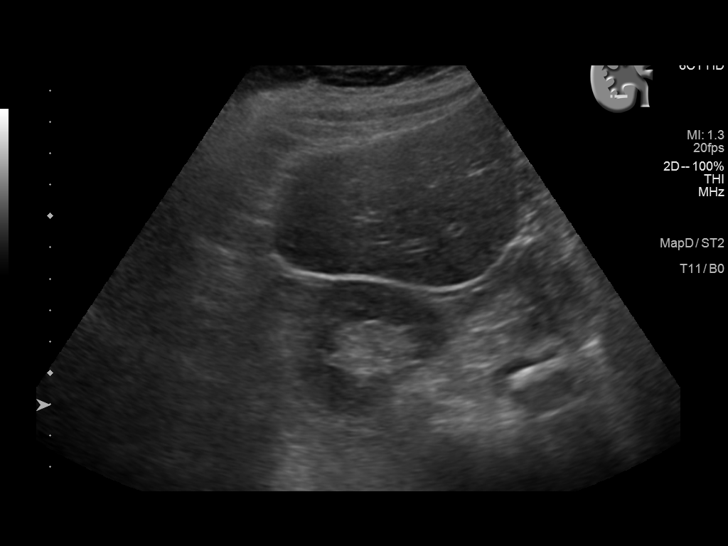
[im 13/23]
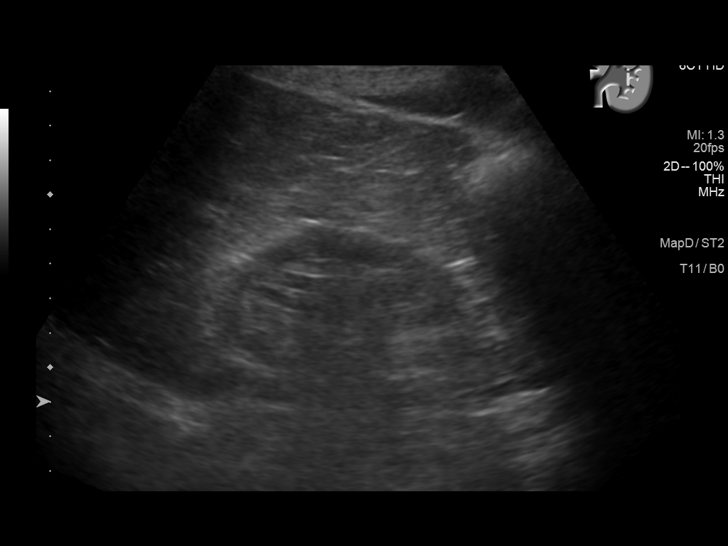
[im 14/23]
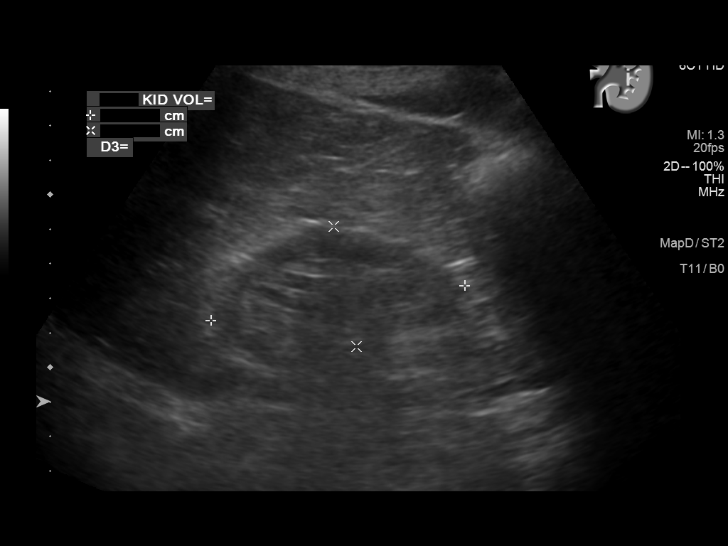
[im 16/23]
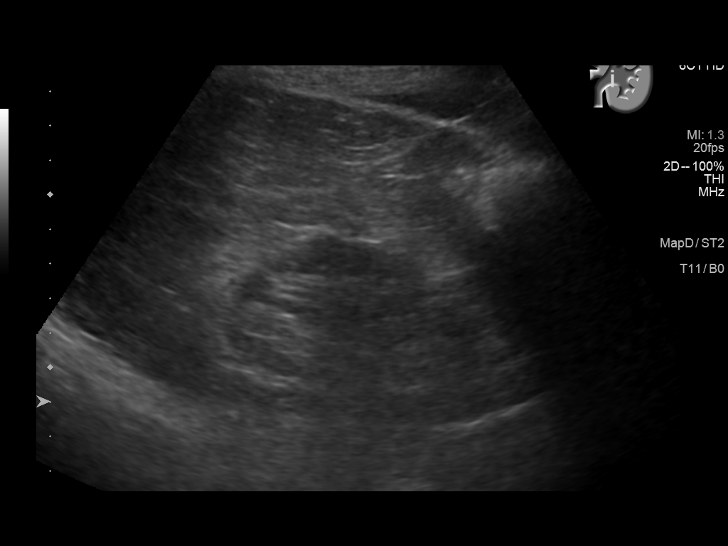
[im 18/23]
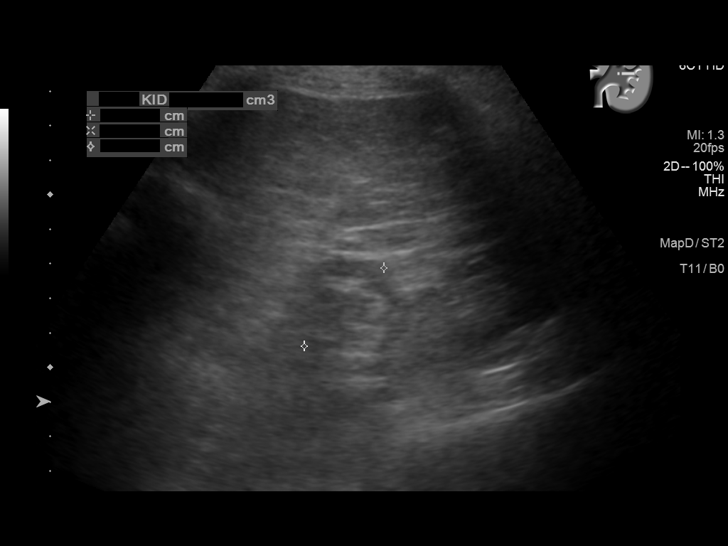
[im 19/23]
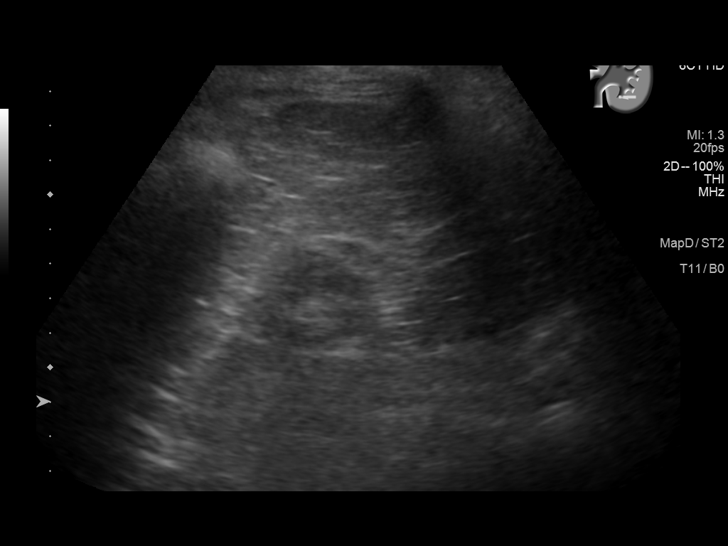
[im 21/23]
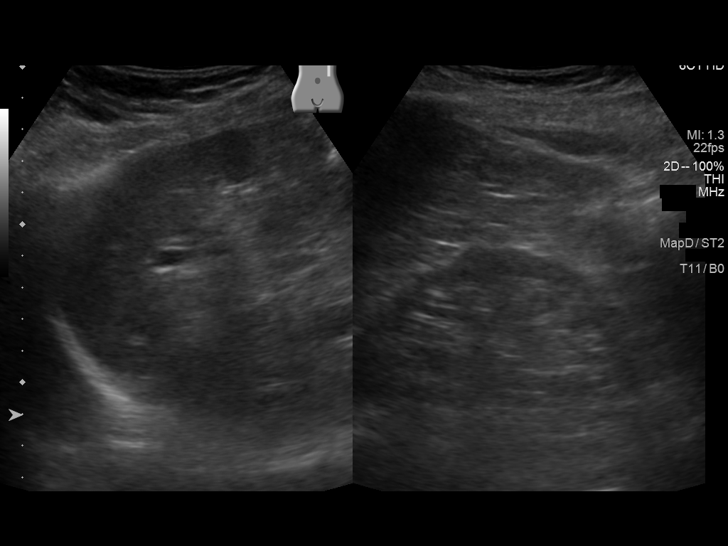
[im 23/23]
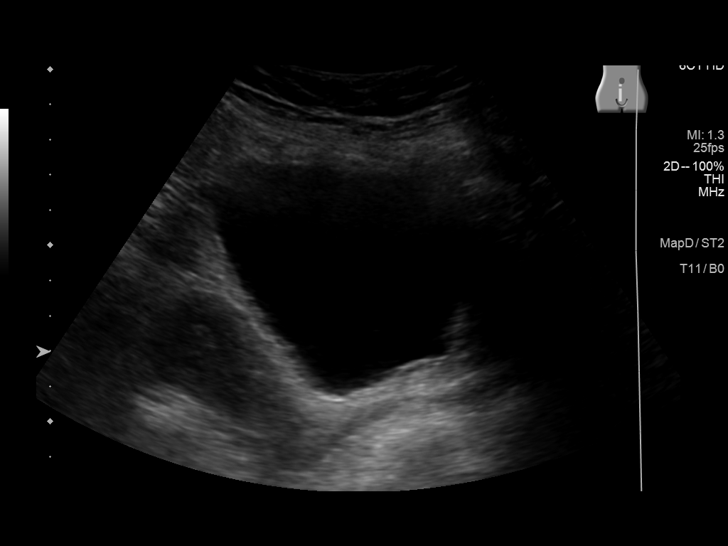

[14 of 23 positions shown; findings below may reference images not displayed]

FINDINGS: Right Kidney:

Renal measurements: 10.7 x 5.2 x 5.0 cm = volume: 148 mL.
Echogenicity within normal limits. No mass or hydronephrosis
visualized.

Left Kidney:

Renal measurements: 7.4 x 3.5 x 3.2 cm = volume: 44 mL. Echogenicity
within normal limits. No mass or hydronephrosis visualized.

Bladder:

Appears normal for degree of bladder distention.

Other:

None.
IMPRESSION: The left kidney is slightly atrophied in size. Normal sonographic
appearance of the right kidney.

## 2021-09-12 DIAGNOSIS — S80822D Blister (nonthermal), left lower leg, subsequent encounter: Secondary | ICD-10-CM | POA: Diagnosis not present

## 2021-09-12 DIAGNOSIS — S80821D Blister (nonthermal), right lower leg, subsequent encounter: Secondary | ICD-10-CM | POA: Diagnosis not present

## 2021-09-12 DIAGNOSIS — N184 Chronic kidney disease, stage 4 (severe): Secondary | ICD-10-CM | POA: Diagnosis not present

## 2021-09-12 DIAGNOSIS — M7989 Other specified soft tissue disorders: Secondary | ICD-10-CM | POA: Diagnosis not present

## 2021-09-12 DIAGNOSIS — R600 Localized edema: Secondary | ICD-10-CM | POA: Diagnosis not present

## 2021-09-16 DIAGNOSIS — N184 Chronic kidney disease, stage 4 (severe): Secondary | ICD-10-CM | POA: Diagnosis not present

## 2021-09-16 DIAGNOSIS — E039 Hypothyroidism, unspecified: Secondary | ICD-10-CM | POA: Diagnosis not present

## 2021-09-16 DIAGNOSIS — I10 Essential (primary) hypertension: Secondary | ICD-10-CM | POA: Diagnosis not present

## 2021-09-16 DIAGNOSIS — E1122 Type 2 diabetes mellitus with diabetic chronic kidney disease: Secondary | ICD-10-CM | POA: Diagnosis not present

## 2021-09-16 DIAGNOSIS — F0150 Vascular dementia without behavioral disturbance: Secondary | ICD-10-CM | POA: Diagnosis not present

## 2021-09-16 DIAGNOSIS — R600 Localized edema: Secondary | ICD-10-CM | POA: Diagnosis not present

## 2021-09-16 DIAGNOSIS — L03119 Cellulitis of unspecified part of limb: Secondary | ICD-10-CM | POA: Diagnosis not present

## 2021-09-16 DIAGNOSIS — G309 Alzheimer's disease, unspecified: Secondary | ICD-10-CM | POA: Diagnosis not present

## 2021-09-16 DIAGNOSIS — I890 Lymphedema, not elsewhere classified: Secondary | ICD-10-CM | POA: Diagnosis not present

## 2021-09-16 DIAGNOSIS — F0280 Dementia in other diseases classified elsewhere without behavioral disturbance: Secondary | ICD-10-CM | POA: Diagnosis not present

## 2021-09-27 DIAGNOSIS — R238 Other skin changes: Secondary | ICD-10-CM | POA: Diagnosis not present

## 2021-09-27 DIAGNOSIS — R600 Localized edema: Secondary | ICD-10-CM | POA: Diagnosis not present

## 2021-10-15 DIAGNOSIS — F02B0 Dementia in other diseases classified elsewhere, moderate, without behavioral disturbance, psychotic disturbance, mood disturbance, and anxiety: Secondary | ICD-10-CM | POA: Diagnosis not present

## 2021-10-15 DIAGNOSIS — I509 Heart failure, unspecified: Secondary | ICD-10-CM | POA: Diagnosis not present

## 2021-10-15 DIAGNOSIS — Z515 Encounter for palliative care: Secondary | ICD-10-CM | POA: Diagnosis not present

## 2021-10-15 DIAGNOSIS — N184 Chronic kidney disease, stage 4 (severe): Secondary | ICD-10-CM | POA: Diagnosis not present

## 2021-10-15 DIAGNOSIS — E1122 Type 2 diabetes mellitus with diabetic chronic kidney disease: Secondary | ICD-10-CM | POA: Diagnosis not present

## 2021-10-15 DIAGNOSIS — Z66 Do not resuscitate: Secondary | ICD-10-CM | POA: Diagnosis not present

## 2021-10-15 DIAGNOSIS — E1140 Type 2 diabetes mellitus with diabetic neuropathy, unspecified: Secondary | ICD-10-CM | POA: Diagnosis not present

## 2021-10-15 DIAGNOSIS — Z7984 Long term (current) use of oral hypoglycemic drugs: Secondary | ICD-10-CM | POA: Diagnosis not present

## 2021-10-15 DIAGNOSIS — R600 Localized edema: Secondary | ICD-10-CM | POA: Diagnosis not present

## 2021-10-15 DIAGNOSIS — G309 Alzheimer's disease, unspecified: Secondary | ICD-10-CM | POA: Diagnosis not present

## 2021-10-17 DIAGNOSIS — N390 Urinary tract infection, site not specified: Secondary | ICD-10-CM | POA: Diagnosis not present

## 2021-10-23 DIAGNOSIS — N390 Urinary tract infection, site not specified: Secondary | ICD-10-CM | POA: Diagnosis not present

## 2021-11-11 DIAGNOSIS — Z66 Do not resuscitate: Secondary | ICD-10-CM | POA: Diagnosis not present

## 2021-11-11 DIAGNOSIS — Z515 Encounter for palliative care: Secondary | ICD-10-CM | POA: Diagnosis not present

## 2021-11-11 DIAGNOSIS — F03911 Unspecified dementia, unspecified severity, with agitation: Secondary | ICD-10-CM | POA: Diagnosis not present

## 2021-11-11 DIAGNOSIS — Z9183 Wandering in diseases classified elsewhere: Secondary | ICD-10-CM | POA: Diagnosis not present

## 2021-11-11 DIAGNOSIS — R600 Localized edema: Secondary | ICD-10-CM | POA: Diagnosis not present

## 2021-11-11 DIAGNOSIS — N184 Chronic kidney disease, stage 4 (severe): Secondary | ICD-10-CM | POA: Diagnosis not present

## 2021-11-11 DIAGNOSIS — F03918 Unspecified dementia, unspecified severity, with other behavioral disturbance: Secondary | ICD-10-CM | POA: Diagnosis not present

## 2021-11-11 DIAGNOSIS — I739 Peripheral vascular disease, unspecified: Secondary | ICD-10-CM | POA: Diagnosis not present

## 2021-11-25 DIAGNOSIS — R8290 Unspecified abnormal findings in urine: Secondary | ICD-10-CM | POA: Diagnosis not present

## 2021-11-25 DIAGNOSIS — N184 Chronic kidney disease, stage 4 (severe): Secondary | ICD-10-CM | POA: Diagnosis not present

## 2021-11-25 DIAGNOSIS — G309 Alzheimer's disease, unspecified: Secondary | ICD-10-CM | POA: Diagnosis not present

## 2021-11-25 DIAGNOSIS — F0150 Vascular dementia without behavioral disturbance: Secondary | ICD-10-CM | POA: Diagnosis not present

## 2021-11-25 DIAGNOSIS — R600 Localized edema: Secondary | ICD-10-CM | POA: Diagnosis not present

## 2021-11-25 DIAGNOSIS — E1122 Type 2 diabetes mellitus with diabetic chronic kidney disease: Secondary | ICD-10-CM | POA: Diagnosis not present

## 2021-11-25 DIAGNOSIS — E039 Hypothyroidism, unspecified: Secondary | ICD-10-CM | POA: Diagnosis not present

## 2021-11-25 DIAGNOSIS — F0280 Dementia in other diseases classified elsewhere without behavioral disturbance: Secondary | ICD-10-CM | POA: Diagnosis not present

## 2021-12-02 DIAGNOSIS — I509 Heart failure, unspecified: Secondary | ICD-10-CM | POA: Diagnosis not present

## 2021-12-02 DIAGNOSIS — F03911 Unspecified dementia, unspecified severity, with agitation: Secondary | ICD-10-CM | POA: Diagnosis not present

## 2021-12-02 DIAGNOSIS — L603 Nail dystrophy: Secondary | ICD-10-CM | POA: Diagnosis not present

## 2021-12-02 DIAGNOSIS — F0150 Vascular dementia without behavioral disturbance: Secondary | ICD-10-CM | POA: Diagnosis not present

## 2021-12-02 DIAGNOSIS — E039 Hypothyroidism, unspecified: Secondary | ICD-10-CM | POA: Diagnosis not present

## 2021-12-02 DIAGNOSIS — G309 Alzheimer's disease, unspecified: Secondary | ICD-10-CM | POA: Diagnosis not present

## 2021-12-02 DIAGNOSIS — G301 Alzheimer's disease with late onset: Secondary | ICD-10-CM | POA: Diagnosis not present

## 2021-12-02 DIAGNOSIS — I890 Lymphedema, not elsewhere classified: Secondary | ICD-10-CM | POA: Diagnosis not present

## 2021-12-02 DIAGNOSIS — Z9183 Wandering in diseases classified elsewhere: Secondary | ICD-10-CM | POA: Diagnosis not present

## 2021-12-02 DIAGNOSIS — Z6832 Body mass index (BMI) 32.0-32.9, adult: Secondary | ICD-10-CM | POA: Diagnosis not present

## 2021-12-02 DIAGNOSIS — N184 Chronic kidney disease, stage 4 (severe): Secondary | ICD-10-CM | POA: Diagnosis not present

## 2021-12-02 DIAGNOSIS — Z66 Do not resuscitate: Secondary | ICD-10-CM | POA: Diagnosis not present

## 2021-12-02 DIAGNOSIS — Z515 Encounter for palliative care: Secondary | ICD-10-CM | POA: Diagnosis not present

## 2021-12-02 DIAGNOSIS — Z0000 Encounter for general adult medical examination without abnormal findings: Secondary | ICD-10-CM | POA: Diagnosis not present

## 2021-12-02 DIAGNOSIS — C73 Malignant neoplasm of thyroid gland: Secondary | ICD-10-CM | POA: Diagnosis not present

## 2021-12-02 DIAGNOSIS — R600 Localized edema: Secondary | ICD-10-CM | POA: Diagnosis not present

## 2021-12-02 DIAGNOSIS — E1122 Type 2 diabetes mellitus with diabetic chronic kidney disease: Secondary | ICD-10-CM | POA: Diagnosis not present

## 2021-12-02 DIAGNOSIS — F3341 Major depressive disorder, recurrent, in partial remission: Secondary | ICD-10-CM | POA: Diagnosis not present

## 2021-12-16 DIAGNOSIS — E1140 Type 2 diabetes mellitus with diabetic neuropathy, unspecified: Secondary | ICD-10-CM | POA: Diagnosis not present

## 2021-12-16 DIAGNOSIS — L600 Ingrowing nail: Secondary | ICD-10-CM | POA: Diagnosis not present

## 2021-12-16 DIAGNOSIS — B351 Tinea unguium: Secondary | ICD-10-CM | POA: Diagnosis not present

## 2021-12-31 ENCOUNTER — Ambulatory Visit (INDEPENDENT_AMBULATORY_CARE_PROVIDER_SITE_OTHER): Payer: PPO | Admitting: Nurse Practitioner

## 2022-01-11 ENCOUNTER — Encounter: Payer: Self-pay | Admitting: Internal Medicine

## 2022-01-21 DIAGNOSIS — F0153 Vascular dementia, unspecified severity, with mood disturbance: Secondary | ICD-10-CM | POA: Diagnosis not present

## 2022-01-21 DIAGNOSIS — I69318 Other symptoms and signs involving cognitive functions following cerebral infarction: Secondary | ICD-10-CM | POA: Diagnosis not present

## 2022-01-21 DIAGNOSIS — E1151 Type 2 diabetes mellitus with diabetic peripheral angiopathy without gangrene: Secondary | ICD-10-CM | POA: Diagnosis not present

## 2022-01-21 DIAGNOSIS — I70249 Atherosclerosis of native arteries of left leg with ulceration of unspecified site: Secondary | ICD-10-CM | POA: Diagnosis not present

## 2022-01-21 DIAGNOSIS — L97929 Non-pressure chronic ulcer of unspecified part of left lower leg with unspecified severity: Secondary | ICD-10-CM | POA: Diagnosis not present

## 2022-01-21 DIAGNOSIS — L03116 Cellulitis of left lower limb: Secondary | ICD-10-CM | POA: Diagnosis not present

## 2022-01-21 DIAGNOSIS — I69391 Dysphagia following cerebral infarction: Secondary | ICD-10-CM | POA: Diagnosis not present

## 2022-01-23 ENCOUNTER — Ambulatory Visit: Payer: PPO | Admitting: Internal Medicine

## 2022-03-27 DIAGNOSIS — R410 Disorientation, unspecified: Secondary | ICD-10-CM | POA: Diagnosis not present

## 2022-03-27 DIAGNOSIS — M1990 Unspecified osteoarthritis, unspecified site: Secondary | ICD-10-CM | POA: Diagnosis not present

## 2022-03-27 DIAGNOSIS — F3341 Major depressive disorder, recurrent, in partial remission: Secondary | ICD-10-CM | POA: Diagnosis not present

## 2022-03-27 DIAGNOSIS — Z9181 History of falling: Secondary | ICD-10-CM | POA: Diagnosis not present

## 2022-03-27 DIAGNOSIS — R600 Localized edema: Secondary | ICD-10-CM | POA: Diagnosis not present

## 2022-03-27 DIAGNOSIS — F0150 Vascular dementia without behavioral disturbance: Secondary | ICD-10-CM | POA: Diagnosis not present

## 2022-03-27 DIAGNOSIS — E119 Type 2 diabetes mellitus without complications: Secondary | ICD-10-CM | POA: Diagnosis not present

## 2022-03-27 DIAGNOSIS — D649 Anemia, unspecified: Secondary | ICD-10-CM | POA: Diagnosis not present

## 2022-03-27 DIAGNOSIS — I10 Essential (primary) hypertension: Secondary | ICD-10-CM | POA: Diagnosis not present

## 2022-03-27 DIAGNOSIS — C73 Malignant neoplasm of thyroid gland: Secondary | ICD-10-CM | POA: Diagnosis not present

## 2022-03-27 DIAGNOSIS — E1122 Type 2 diabetes mellitus with diabetic chronic kidney disease: Secondary | ICD-10-CM | POA: Diagnosis not present

## 2022-03-27 DIAGNOSIS — G309 Alzheimer's disease, unspecified: Secondary | ICD-10-CM | POA: Diagnosis not present

## 2022-03-27 DIAGNOSIS — G301 Alzheimer's disease with late onset: Secondary | ICD-10-CM | POA: Diagnosis not present

## 2022-03-27 DIAGNOSIS — N184 Chronic kidney disease, stage 4 (severe): Secondary | ICD-10-CM | POA: Diagnosis not present

## 2022-03-27 DIAGNOSIS — I890 Lymphedema, not elsewhere classified: Secondary | ICD-10-CM | POA: Diagnosis not present

## 2022-03-27 DIAGNOSIS — E039 Hypothyroidism, unspecified: Secondary | ICD-10-CM | POA: Diagnosis not present

## 2022-04-07 DIAGNOSIS — B351 Tinea unguium: Secondary | ICD-10-CM | POA: Diagnosis not present

## 2022-04-07 DIAGNOSIS — E1140 Type 2 diabetes mellitus with diabetic neuropathy, unspecified: Secondary | ICD-10-CM | POA: Diagnosis not present

## 2022-04-15 DIAGNOSIS — I69320 Aphasia following cerebral infarction: Secondary | ICD-10-CM | POA: Diagnosis not present

## 2022-04-15 DIAGNOSIS — E1151 Type 2 diabetes mellitus with diabetic peripheral angiopathy without gangrene: Secondary | ICD-10-CM | POA: Diagnosis not present

## 2022-04-15 DIAGNOSIS — I70249 Atherosclerosis of native arteries of left leg with ulceration of unspecified site: Secondary | ICD-10-CM | POA: Diagnosis not present

## 2022-04-15 DIAGNOSIS — L97929 Non-pressure chronic ulcer of unspecified part of left lower leg with unspecified severity: Secondary | ICD-10-CM | POA: Diagnosis not present

## 2022-04-15 DIAGNOSIS — I69318 Other symptoms and signs involving cognitive functions following cerebral infarction: Secondary | ICD-10-CM | POA: Diagnosis not present

## 2022-04-15 DIAGNOSIS — F0153 Vascular dementia, unspecified severity, with mood disturbance: Secondary | ICD-10-CM | POA: Diagnosis not present

## 2022-04-15 DIAGNOSIS — I69391 Dysphagia following cerebral infarction: Secondary | ICD-10-CM | POA: Diagnosis not present

## 2022-07-01 DIAGNOSIS — R600 Localized edema: Secondary | ICD-10-CM | POA: Diagnosis not present

## 2022-07-01 DIAGNOSIS — D631 Anemia in chronic kidney disease: Secondary | ICD-10-CM | POA: Diagnosis not present

## 2022-07-01 DIAGNOSIS — R911 Solitary pulmonary nodule: Secondary | ICD-10-CM | POA: Diagnosis not present

## 2022-07-01 DIAGNOSIS — I10 Essential (primary) hypertension: Secondary | ICD-10-CM | POA: Diagnosis not present

## 2022-07-01 DIAGNOSIS — J22 Unspecified acute lower respiratory infection: Secondary | ICD-10-CM | POA: Diagnosis not present

## 2022-07-01 DIAGNOSIS — F3341 Major depressive disorder, recurrent, in partial remission: Secondary | ICD-10-CM | POA: Diagnosis not present

## 2022-07-01 DIAGNOSIS — U071 COVID-19: Secondary | ICD-10-CM | POA: Diagnosis not present

## 2022-07-01 DIAGNOSIS — F0150 Vascular dementia without behavioral disturbance: Secondary | ICD-10-CM | POA: Diagnosis not present

## 2022-07-01 DIAGNOSIS — E119 Type 2 diabetes mellitus without complications: Secondary | ICD-10-CM | POA: Diagnosis not present

## 2022-07-01 DIAGNOSIS — E663 Overweight: Secondary | ICD-10-CM | POA: Diagnosis not present

## 2022-07-01 DIAGNOSIS — N189 Chronic kidney disease, unspecified: Secondary | ICD-10-CM | POA: Diagnosis not present

## 2022-07-01 DIAGNOSIS — G309 Alzheimer's disease, unspecified: Secondary | ICD-10-CM | POA: Diagnosis not present

## 2022-07-01 DIAGNOSIS — Z6832 Body mass index (BMI) 32.0-32.9, adult: Secondary | ICD-10-CM | POA: Diagnosis not present

## 2022-07-01 DIAGNOSIS — Z0000 Encounter for general adult medical examination without abnormal findings: Secondary | ICD-10-CM | POA: Diagnosis not present

## 2022-07-01 DIAGNOSIS — G301 Alzheimer's disease with late onset: Secondary | ICD-10-CM | POA: Diagnosis not present

## 2022-07-01 DIAGNOSIS — F0280 Dementia in other diseases classified elsewhere without behavioral disturbance: Secondary | ICD-10-CM | POA: Diagnosis not present

## 2022-07-01 DIAGNOSIS — I890 Lymphedema, not elsewhere classified: Secondary | ICD-10-CM | POA: Diagnosis not present

## 2022-07-02 DIAGNOSIS — G309 Alzheimer's disease, unspecified: Secondary | ICD-10-CM | POA: Diagnosis not present

## 2022-07-02 DIAGNOSIS — N189 Chronic kidney disease, unspecified: Secondary | ICD-10-CM | POA: Diagnosis not present

## 2022-07-02 DIAGNOSIS — F0280 Dementia in other diseases classified elsewhere without behavioral disturbance: Secondary | ICD-10-CM | POA: Diagnosis not present

## 2022-07-02 DIAGNOSIS — I890 Lymphedema, not elsewhere classified: Secondary | ICD-10-CM | POA: Diagnosis not present

## 2022-07-02 DIAGNOSIS — D631 Anemia in chronic kidney disease: Secondary | ICD-10-CM | POA: Diagnosis not present

## 2022-07-02 DIAGNOSIS — R600 Localized edema: Secondary | ICD-10-CM | POA: Diagnosis not present

## 2022-07-02 DIAGNOSIS — F0150 Vascular dementia without behavioral disturbance: Secondary | ICD-10-CM | POA: Diagnosis not present

## 2022-07-02 DIAGNOSIS — E119 Type 2 diabetes mellitus without complications: Secondary | ICD-10-CM | POA: Diagnosis not present

## 2022-07-02 DIAGNOSIS — I10 Essential (primary) hypertension: Secondary | ICD-10-CM | POA: Diagnosis not present

## 2022-07-02 DIAGNOSIS — F3341 Major depressive disorder, recurrent, in partial remission: Secondary | ICD-10-CM | POA: Diagnosis not present

## 2022-07-02 DIAGNOSIS — J22 Unspecified acute lower respiratory infection: Secondary | ICD-10-CM | POA: Diagnosis not present

## 2022-07-02 DIAGNOSIS — U071 COVID-19: Secondary | ICD-10-CM | POA: Diagnosis not present

## 2022-08-01 ENCOUNTER — Telehealth: Payer: Self-pay | Admitting: *Deleted

## 2022-08-01 NOTE — Patient Outreach (Signed)
  Care Coordination   Initial Visit Note   08/01/2022 Name: Alice Weaver MRN: [id number] DOB: 1940-10-23  Alice Weaver is a 81 y.o. year old female who sees Tracie Harrier, MD for primary care.   What matters to the patients health and wellness today?  Call placed to [facility], confirmed she remains active with hospice.  Will not open case at this time.   SDOH assessments and interventions completed:  No     Care Coordination Interventions Activated:  No  Care Coordination Interventions:  No, not indicated   Follow up plan: No further intervention required.   Encounter Outcome:  Pt. Visit Completed   Valente David, RN, MSN, [facility] Care Management Coordinator [phone]

## 2022-09-03 DIAGNOSIS — R8290 Unspecified abnormal findings in urine: Secondary | ICD-10-CM | POA: Diagnosis not present

## 2022-09-03 DIAGNOSIS — R399 Unspecified symptoms and signs involving the genitourinary system: Secondary | ICD-10-CM | POA: Diagnosis not present

## 2022-09-11 DIAGNOSIS — E1151 Type 2 diabetes mellitus with diabetic peripheral angiopathy without gangrene: Secondary | ICD-10-CM | POA: Diagnosis not present

## 2022-09-11 DIAGNOSIS — I69391 Dysphagia following cerebral infarction: Secondary | ICD-10-CM | POA: Diagnosis not present

## 2022-09-11 DIAGNOSIS — I69318 Other symptoms and signs involving cognitive functions following cerebral infarction: Secondary | ICD-10-CM | POA: Diagnosis not present

## 2022-09-11 DIAGNOSIS — F0153 Vascular dementia, unspecified severity, with mood disturbance: Secondary | ICD-10-CM | POA: Diagnosis not present

## 2022-09-11 DIAGNOSIS — I69320 Aphasia following cerebral infarction: Secondary | ICD-10-CM | POA: Diagnosis not present

## 2022-09-11 DIAGNOSIS — F01511 Vascular dementia, unspecified severity, with agitation: Secondary | ICD-10-CM | POA: Diagnosis not present

## 2022-09-11 DIAGNOSIS — I70249 Atherosclerosis of native arteries of left leg with ulceration of unspecified site: Secondary | ICD-10-CM | POA: Diagnosis not present

## 2022-10-02 DIAGNOSIS — F01511 Vascular dementia, unspecified severity, with agitation: Secondary | ICD-10-CM | POA: Diagnosis not present

## 2022-10-02 DIAGNOSIS — I69318 Other symptoms and signs involving cognitive functions following cerebral infarction: Secondary | ICD-10-CM | POA: Diagnosis not present

## 2022-10-02 DIAGNOSIS — I69391 Dysphagia following cerebral infarction: Secondary | ICD-10-CM | POA: Diagnosis not present

## 2022-10-02 DIAGNOSIS — I69320 Aphasia following cerebral infarction: Secondary | ICD-10-CM | POA: Diagnosis not present

## 2022-10-02 DIAGNOSIS — I70249 Atherosclerosis of native arteries of left leg with ulceration of unspecified site: Secondary | ICD-10-CM | POA: Diagnosis not present

## 2022-10-02 DIAGNOSIS — F0153 Vascular dementia, unspecified severity, with mood disturbance: Secondary | ICD-10-CM | POA: Diagnosis not present

## 2022-10-02 DIAGNOSIS — E1151 Type 2 diabetes mellitus with diabetic peripheral angiopathy without gangrene: Secondary | ICD-10-CM | POA: Diagnosis not present

## 2022-10-29 DIAGNOSIS — F0280 Dementia in other diseases classified elsewhere without behavioral disturbance: Secondary | ICD-10-CM | POA: Diagnosis not present

## 2022-10-29 DIAGNOSIS — F0150 Vascular dementia without behavioral disturbance: Secondary | ICD-10-CM | POA: Diagnosis not present

## 2022-10-29 DIAGNOSIS — G309 Alzheimer's disease, unspecified: Secondary | ICD-10-CM | POA: Diagnosis not present

## 2022-10-29 DIAGNOSIS — L89152 Pressure ulcer of sacral region, stage 2: Secondary | ICD-10-CM | POA: Diagnosis not present

## 2022-10-29 DIAGNOSIS — R5383 Other fatigue: Secondary | ICD-10-CM | POA: Diagnosis not present

## 2022-10-29 DIAGNOSIS — I10 Essential (primary) hypertension: Secondary | ICD-10-CM | POA: Diagnosis not present

## 2022-10-29 DIAGNOSIS — R600 Localized edema: Secondary | ICD-10-CM | POA: Diagnosis not present

## 2022-10-29 DIAGNOSIS — E119 Type 2 diabetes mellitus without complications: Secondary | ICD-10-CM | POA: Diagnosis not present

## 2022-11-12 DIAGNOSIS — F01511 Vascular dementia, unspecified severity, with agitation: Secondary | ICD-10-CM | POA: Diagnosis not present

## 2022-11-12 DIAGNOSIS — I69391 Dysphagia following cerebral infarction: Secondary | ICD-10-CM | POA: Diagnosis not present

## 2022-11-12 DIAGNOSIS — I69318 Other symptoms and signs involving cognitive functions following cerebral infarction: Secondary | ICD-10-CM | POA: Diagnosis not present

## 2022-11-12 DIAGNOSIS — F0153 Vascular dementia, unspecified severity, with mood disturbance: Secondary | ICD-10-CM | POA: Diagnosis not present

## 2022-11-12 DIAGNOSIS — I70249 Atherosclerosis of native arteries of left leg with ulceration of unspecified site: Secondary | ICD-10-CM | POA: Diagnosis not present

## 2022-11-12 DIAGNOSIS — E1151 Type 2 diabetes mellitus with diabetic peripheral angiopathy without gangrene: Secondary | ICD-10-CM | POA: Diagnosis not present

## 2022-11-12 DIAGNOSIS — I69320 Aphasia following cerebral infarction: Secondary | ICD-10-CM | POA: Diagnosis not present

## 2022-12-12 DIAGNOSIS — R600 Localized edema: Secondary | ICD-10-CM | POA: Diagnosis not present

## 2022-12-12 DIAGNOSIS — E039 Hypothyroidism, unspecified: Secondary | ICD-10-CM | POA: Diagnosis not present

## 2022-12-12 DIAGNOSIS — Z0000 Encounter for general adult medical examination without abnormal findings: Secondary | ICD-10-CM | POA: Diagnosis not present

## 2022-12-12 DIAGNOSIS — R609 Edema, unspecified: Secondary | ICD-10-CM | POA: Diagnosis not present

## 2022-12-12 DIAGNOSIS — E669 Obesity, unspecified: Secondary | ICD-10-CM | POA: Diagnosis not present

## 2022-12-12 DIAGNOSIS — F3341 Major depressive disorder, recurrent, in partial remission: Secondary | ICD-10-CM | POA: Diagnosis not present

## 2022-12-12 DIAGNOSIS — R5383 Other fatigue: Secondary | ICD-10-CM | POA: Diagnosis not present

## 2022-12-12 DIAGNOSIS — N184 Chronic kidney disease, stage 4 (severe): Secondary | ICD-10-CM | POA: Diagnosis not present

## 2022-12-12 DIAGNOSIS — F0280 Dementia in other diseases classified elsewhere without behavioral disturbance: Secondary | ICD-10-CM | POA: Diagnosis not present

## 2022-12-12 DIAGNOSIS — J209 Acute bronchitis, unspecified: Secondary | ICD-10-CM | POA: Diagnosis not present

## 2022-12-12 DIAGNOSIS — D631 Anemia in chronic kidney disease: Secondary | ICD-10-CM | POA: Diagnosis not present

## 2022-12-12 DIAGNOSIS — G309 Alzheimer's disease, unspecified: Secondary | ICD-10-CM | POA: Diagnosis not present

## 2022-12-12 DIAGNOSIS — F0150 Vascular dementia without behavioral disturbance: Secondary | ICD-10-CM | POA: Diagnosis not present

## 2022-12-12 DIAGNOSIS — I10 Essential (primary) hypertension: Secondary | ICD-10-CM | POA: Diagnosis not present

## 2022-12-12 DIAGNOSIS — N1832 Chronic kidney disease, stage 3b: Secondary | ICD-10-CM | POA: Diagnosis not present

## 2022-12-12 DIAGNOSIS — E1122 Type 2 diabetes mellitus with diabetic chronic kidney disease: Secondary | ICD-10-CM | POA: Diagnosis not present

## 2022-12-12 DIAGNOSIS — Z6832 Body mass index (BMI) 32.0-32.9, adult: Secondary | ICD-10-CM | POA: Diagnosis not present

## 2022-12-12 DIAGNOSIS — N183 Chronic kidney disease, stage 3 unspecified: Secondary | ICD-10-CM | POA: Diagnosis not present

## 2022-12-12 DIAGNOSIS — I890 Lymphedema, not elsewhere classified: Secondary | ICD-10-CM | POA: Diagnosis not present

## 2022-12-22 DIAGNOSIS — E1122 Type 2 diabetes mellitus with diabetic chronic kidney disease: Secondary | ICD-10-CM | POA: Diagnosis not present

## 2022-12-22 DIAGNOSIS — E039 Hypothyroidism, unspecified: Secondary | ICD-10-CM | POA: Diagnosis not present

## 2022-12-22 DIAGNOSIS — I10 Essential (primary) hypertension: Secondary | ICD-10-CM | POA: Diagnosis not present

## 2022-12-22 DIAGNOSIS — G309 Alzheimer's disease, unspecified: Secondary | ICD-10-CM | POA: Diagnosis not present

## 2022-12-22 DIAGNOSIS — N184 Chronic kidney disease, stage 4 (severe): Secondary | ICD-10-CM | POA: Diagnosis not present

## 2022-12-23 ENCOUNTER — Other Ambulatory Visit: Payer: Self-pay | Admitting: Internal Medicine

## 2022-12-23 DIAGNOSIS — J984 Other disorders of lung: Secondary | ICD-10-CM

## 2023-01-05 DIAGNOSIS — I69318 Other symptoms and signs involving cognitive functions following cerebral infarction: Secondary | ICD-10-CM | POA: Diagnosis not present

## 2023-01-05 DIAGNOSIS — E1151 Type 2 diabetes mellitus with diabetic peripheral angiopathy without gangrene: Secondary | ICD-10-CM | POA: Diagnosis not present

## 2023-01-05 DIAGNOSIS — I69320 Aphasia following cerebral infarction: Secondary | ICD-10-CM | POA: Diagnosis not present

## 2023-01-05 DIAGNOSIS — J9691 Respiratory failure, unspecified with hypoxia: Secondary | ICD-10-CM | POA: Diagnosis not present

## 2023-01-05 DIAGNOSIS — I69391 Dysphagia following cerebral infarction: Secondary | ICD-10-CM | POA: Diagnosis not present

## 2023-01-05 DIAGNOSIS — F01511 Vascular dementia, unspecified severity, with agitation: Secondary | ICD-10-CM | POA: Diagnosis not present

## 2023-01-05 DIAGNOSIS — F0153 Vascular dementia, unspecified severity, with mood disturbance: Secondary | ICD-10-CM | POA: Diagnosis not present

## 2023-01-26 DIAGNOSIS — N184 Chronic kidney disease, stage 4 (severe): Secondary | ICD-10-CM | POA: Diagnosis not present

## 2023-01-26 DIAGNOSIS — G309 Alzheimer's disease, unspecified: Secondary | ICD-10-CM | POA: Diagnosis not present

## 2023-01-26 DIAGNOSIS — E039 Hypothyroidism, unspecified: Secondary | ICD-10-CM | POA: Diagnosis not present

## 2023-01-26 DIAGNOSIS — E1122 Type 2 diabetes mellitus with diabetic chronic kidney disease: Secondary | ICD-10-CM | POA: Diagnosis not present

## 2023-02-20 DIAGNOSIS — E039 Hypothyroidism, unspecified: Secondary | ICD-10-CM | POA: Diagnosis not present

## 2023-02-20 DIAGNOSIS — E1122 Type 2 diabetes mellitus with diabetic chronic kidney disease: Secondary | ICD-10-CM | POA: Diagnosis not present

## 2023-02-20 DIAGNOSIS — G309 Alzheimer's disease, unspecified: Secondary | ICD-10-CM | POA: Diagnosis not present

## 2023-02-20 DIAGNOSIS — N184 Chronic kidney disease, stage 4 (severe): Secondary | ICD-10-CM | POA: Diagnosis not present

## 2023-03-16 DIAGNOSIS — E1122 Type 2 diabetes mellitus with diabetic chronic kidney disease: Secondary | ICD-10-CM | POA: Diagnosis not present

## 2023-03-16 DIAGNOSIS — G309 Alzheimer's disease, unspecified: Secondary | ICD-10-CM | POA: Diagnosis not present

## 2023-03-16 DIAGNOSIS — C44501 Unspecified malignant neoplasm of skin of breast: Secondary | ICD-10-CM | POA: Diagnosis not present

## 2023-03-16 DIAGNOSIS — F0280 Dementia in other diseases classified elsewhere without behavioral disturbance: Secondary | ICD-10-CM | POA: Diagnosis not present

## 2023-03-16 DIAGNOSIS — G301 Alzheimer's disease with late onset: Secondary | ICD-10-CM | POA: Diagnosis not present

## 2023-03-16 DIAGNOSIS — C73 Malignant neoplasm of thyroid gland: Secondary | ICD-10-CM | POA: Diagnosis not present

## 2023-03-16 DIAGNOSIS — F0150 Vascular dementia without behavioral disturbance: Secondary | ICD-10-CM | POA: Diagnosis not present

## 2023-03-16 DIAGNOSIS — Z9181 History of falling: Secondary | ICD-10-CM | POA: Diagnosis not present

## 2023-03-16 DIAGNOSIS — R262 Difficulty in walking, not elsewhere classified: Secondary | ICD-10-CM | POA: Diagnosis not present

## 2023-03-16 DIAGNOSIS — N184 Chronic kidney disease, stage 4 (severe): Secondary | ICD-10-CM | POA: Diagnosis not present

## 2023-03-16 DIAGNOSIS — Z6832 Body mass index (BMI) 32.0-32.9, adult: Secondary | ICD-10-CM | POA: Diagnosis not present

## 2023-03-16 DIAGNOSIS — R600 Localized edema: Secondary | ICD-10-CM | POA: Diagnosis not present

## 2023-03-16 DIAGNOSIS — F3341 Major depressive disorder, recurrent, in partial remission: Secondary | ICD-10-CM | POA: Diagnosis not present

## 2023-03-16 DIAGNOSIS — I10 Essential (primary) hypertension: Secondary | ICD-10-CM | POA: Diagnosis not present

## 2023-03-16 DIAGNOSIS — G894 Chronic pain syndrome: Secondary | ICD-10-CM | POA: Diagnosis not present

## 2023-03-19 DIAGNOSIS — I69320 Aphasia following cerebral infarction: Secondary | ICD-10-CM | POA: Diagnosis not present

## 2023-03-19 DIAGNOSIS — F01511 Vascular dementia, unspecified severity, with agitation: Secondary | ICD-10-CM | POA: Diagnosis not present

## 2023-03-19 DIAGNOSIS — F0153 Vascular dementia, unspecified severity, with mood disturbance: Secondary | ICD-10-CM | POA: Diagnosis not present

## 2023-03-19 DIAGNOSIS — J9691 Respiratory failure, unspecified with hypoxia: Secondary | ICD-10-CM | POA: Diagnosis not present

## 2023-03-19 DIAGNOSIS — I69391 Dysphagia following cerebral infarction: Secondary | ICD-10-CM | POA: Diagnosis not present

## 2023-03-19 DIAGNOSIS — I69318 Other symptoms and signs involving cognitive functions following cerebral infarction: Secondary | ICD-10-CM | POA: Diagnosis not present

## 2023-03-19 DIAGNOSIS — E1151 Type 2 diabetes mellitus with diabetic peripheral angiopathy without gangrene: Secondary | ICD-10-CM | POA: Diagnosis not present

## 2023-03-24 DIAGNOSIS — N390 Urinary tract infection, site not specified: Secondary | ICD-10-CM | POA: Diagnosis not present

## 2023-03-30 DIAGNOSIS — I10 Essential (primary) hypertension: Secondary | ICD-10-CM | POA: Diagnosis not present

## 2023-03-30 DIAGNOSIS — E1122 Type 2 diabetes mellitus with diabetic chronic kidney disease: Secondary | ICD-10-CM | POA: Diagnosis not present

## 2023-03-30 DIAGNOSIS — Z8673 Personal history of transient ischemic attack (TIA), and cerebral infarction without residual deficits: Secondary | ICD-10-CM | POA: Diagnosis not present

## 2023-03-30 DIAGNOSIS — G3184 Mild cognitive impairment, so stated: Secondary | ICD-10-CM | POA: Diagnosis not present

## 2023-03-30 DIAGNOSIS — N390 Urinary tract infection, site not specified: Secondary | ICD-10-CM | POA: Diagnosis not present

## 2023-03-30 DIAGNOSIS — E039 Hypothyroidism, unspecified: Secondary | ICD-10-CM | POA: Diagnosis not present

## 2023-03-30 DIAGNOSIS — N184 Chronic kidney disease, stage 4 (severe): Secondary | ICD-10-CM | POA: Diagnosis not present

## 2023-03-30 DIAGNOSIS — M6281 Muscle weakness (generalized): Secondary | ICD-10-CM | POA: Diagnosis not present

## 2023-03-30 DIAGNOSIS — I1310 Hypertensive heart and chronic kidney disease without heart failure, with stage 1 through stage 4 chronic kidney disease, or unspecified chronic kidney disease: Secondary | ICD-10-CM | POA: Diagnosis not present

## 2023-03-31 DIAGNOSIS — N390 Urinary tract infection, site not specified: Secondary | ICD-10-CM | POA: Diagnosis not present

## 2023-03-31 DIAGNOSIS — E038 Other specified hypothyroidism: Secondary | ICD-10-CM | POA: Diagnosis not present

## 2023-03-31 DIAGNOSIS — M6281 Muscle weakness (generalized): Secondary | ICD-10-CM | POA: Diagnosis not present

## 2023-03-31 DIAGNOSIS — I1310 Hypertensive heart and chronic kidney disease without heart failure, with stage 1 through stage 4 chronic kidney disease, or unspecified chronic kidney disease: Secondary | ICD-10-CM | POA: Diagnosis not present

## 2023-03-31 DIAGNOSIS — E1122 Type 2 diabetes mellitus with diabetic chronic kidney disease: Secondary | ICD-10-CM | POA: Diagnosis not present

## 2023-03-31 DIAGNOSIS — Z8673 Personal history of transient ischemic attack (TIA), and cerebral infarction without residual deficits: Secondary | ICD-10-CM | POA: Diagnosis not present

## 2023-03-31 DIAGNOSIS — I10 Essential (primary) hypertension: Secondary | ICD-10-CM | POA: Diagnosis not present

## 2023-03-31 DIAGNOSIS — N184 Chronic kidney disease, stage 4 (severe): Secondary | ICD-10-CM | POA: Diagnosis not present

## 2023-03-31 DIAGNOSIS — G3184 Mild cognitive impairment, so stated: Secondary | ICD-10-CM | POA: Diagnosis not present

## 2023-03-31 DIAGNOSIS — H6123 Impacted cerumen, bilateral: Secondary | ICD-10-CM | POA: Diagnosis not present

## 2023-04-09 ENCOUNTER — Emergency Department

## 2023-04-09 ENCOUNTER — Emergency Department
Admission: EM | Admit: 2023-04-09 | Discharge: 2023-04-09 | Disposition: A | Discharge status: Home / Self Care | Attending: Student in an Organized Health Care Education/Training Program | Admitting: Student in an Organized Health Care Education/Training Program

## 2023-04-09 ENCOUNTER — Encounter: Payer: Self-pay | Admitting: Emergency Medicine

## 2023-04-09 ENCOUNTER — Other Ambulatory Visit: Payer: Self-pay

## 2023-04-09 DIAGNOSIS — E039 Hypothyroidism, unspecified: Secondary | ICD-10-CM | POA: Diagnosis not present

## 2023-04-09 DIAGNOSIS — N184 Chronic kidney disease, stage 4 (severe): Secondary | ICD-10-CM | POA: Diagnosis not present

## 2023-04-09 DIAGNOSIS — I6782 Cerebral ischemia: Secondary | ICD-10-CM | POA: Diagnosis not present

## 2023-04-09 DIAGNOSIS — R58 Hemorrhage, not elsewhere classified: Secondary | ICD-10-CM | POA: Diagnosis not present

## 2023-04-09 DIAGNOSIS — E1122 Type 2 diabetes mellitus with diabetic chronic kidney disease: Secondary | ICD-10-CM | POA: Diagnosis not present

## 2023-04-09 DIAGNOSIS — I129 Hypertensive chronic kidney disease with stage 1 through stage 4 chronic kidney disease, or unspecified chronic kidney disease: Secondary | ICD-10-CM | POA: Diagnosis not present

## 2023-04-09 DIAGNOSIS — S0101XA Laceration without foreign body of scalp, initial encounter: Secondary | ICD-10-CM | POA: Insufficient documentation

## 2023-04-09 DIAGNOSIS — Z7401 Bed confinement status: Secondary | ICD-10-CM | POA: Diagnosis not present

## 2023-04-09 DIAGNOSIS — Z85850 Personal history of malignant neoplasm of thyroid: Secondary | ICD-10-CM | POA: Diagnosis not present

## 2023-04-09 DIAGNOSIS — S0990XA Unspecified injury of head, initial encounter: Secondary | ICD-10-CM | POA: Diagnosis not present

## 2023-04-09 DIAGNOSIS — R0902 Hypoxemia: Secondary | ICD-10-CM | POA: Diagnosis not present

## 2023-04-09 DIAGNOSIS — Z85828 Personal history of other malignant neoplasm of skin: Secondary | ICD-10-CM | POA: Diagnosis not present

## 2023-04-09 DIAGNOSIS — Z23 Encounter for immunization: Secondary | ICD-10-CM | POA: Diagnosis not present

## 2023-04-09 DIAGNOSIS — W19XXXA Unspecified fall, initial encounter: Secondary | ICD-10-CM | POA: Diagnosis not present

## 2023-04-09 DIAGNOSIS — M85859 Other specified disorders of bone density and structure, unspecified thigh: Secondary | ICD-10-CM | POA: Diagnosis not present

## 2023-04-09 DIAGNOSIS — I10 Essential (primary) hypertension: Secondary | ICD-10-CM | POA: Diagnosis not present

## 2023-04-09 DIAGNOSIS — S199XXA Unspecified injury of neck, initial encounter: Secondary | ICD-10-CM | POA: Diagnosis not present

## 2023-04-09 DIAGNOSIS — F0390 Unspecified dementia without behavioral disturbance: Secondary | ICD-10-CM | POA: Diagnosis not present

## 2023-04-09 DIAGNOSIS — M160 Bilateral primary osteoarthritis of hip: Secondary | ICD-10-CM | POA: Diagnosis not present

## 2023-04-09 DIAGNOSIS — Z043 Encounter for examination and observation following other accident: Secondary | ICD-10-CM | POA: Diagnosis not present

## 2023-04-09 DIAGNOSIS — R220 Localized swelling, mass and lump, head: Secondary | ICD-10-CM | POA: Diagnosis not present

## 2023-04-09 DIAGNOSIS — Z853 Personal history of malignant neoplasm of breast: Secondary | ICD-10-CM | POA: Insufficient documentation

## 2023-04-09 LAB — BASIC METABOLIC PANEL
Anion gap: 12 (ref 5–15)
BUN: 25 mg/dL — ABNORMAL HIGH (ref 8–23)
CO2: 30 mmol/L (ref 22–32)
Calcium: 8.7 mg/dL — ABNORMAL LOW (ref 8.9–10.3)
Chloride: 98 mmol/L (ref 98–111)
Creatinine, Ser: 1 mg/dL (ref 0.44–1.00)
GFR, Estimated: 56 mL/min — ABNORMAL LOW (ref >60)
Glucose, Bld: 169 mg/dL — ABNORMAL HIGH (ref 70–99)
Potassium: 3.2 mmol/L — ABNORMAL LOW (ref 3.5–5.1)
Sodium: 140 mmol/L (ref 135–145)

## 2023-04-09 LAB — CBC WITH DIFFERENTIAL/PLATELET
Abs Immature Granulocytes: 0.04 10*3/uL (ref 0.00–0.07)
Basophils Absolute: 0 10*3/uL (ref 0.0–0.1)
Basophils Relative: 0 %
Eosinophils Absolute: 0.1 10*3/uL (ref 0.0–0.5)
Eosinophils Relative: 1 %
HCT: 42.9 % (ref 36.0–46.0)
Hemoglobin: 13.8 g/dL (ref 12.0–15.0)
Immature Granulocytes: 1 %
Lymphocytes Relative: 15 %
Lymphs Abs: 1.2 10*3/uL (ref 0.7–4.0)
MCH: 28.6 pg (ref 26.0–34.0)
MCHC: 32.2 g/dL (ref 30.0–36.0)
MCV: 88.8 fL (ref 80.0–100.0)
Monocytes Absolute: 0.5 10*3/uL (ref 0.1–1.0)
Monocytes Relative: 7 %
Neutro Abs: 5.9 10*3/uL (ref 1.7–7.7)
Neutrophils Relative %: 76 %
Platelets: 402 10*3/uL — ABNORMAL HIGH (ref 150–400)
RBC: 4.83 MIL/uL (ref 3.87–5.11)
RDW: 15 % (ref 11.5–15.5)
WBC: 7.7 10*3/uL (ref 4.0–10.5)
nRBC: 0 % (ref 0.0–0.2)

## 2023-04-09 LAB — URINALYSIS, W/ REFLEX TO CULTURE (INFECTION SUSPECTED)
Bacteria, UA: NONE SEEN
Bilirubin Urine: NEGATIVE
Glucose, UA: NEGATIVE mg/dL
Hgb urine dipstick: NEGATIVE
Ketones, ur: NEGATIVE mg/dL
Leukocytes,Ua: NEGATIVE
Nitrite: NEGATIVE
Protein, ur: 30 mg/dL — AB
Specific Gravity, Urine: 1.005 (ref 1.005–1.030)
pH: 7 (ref 5.0–8.0)

## 2023-04-09 MED ORDER — TETANUS-DIPHTH-ACELL PERTUSSIS 5-2.5-18.5 LF-MCG/0.5 IM SUSY
0.5000 mL | PREFILLED_SYRINGE | Freq: Once | INTRAMUSCULAR | Status: AC
  Administered 2023-04-09: 0.5 mL via INTRAMUSCULAR
  Filled 2023-04-09: qty 0.5

## 2023-04-09 NOTE — ED Notes (Signed)
Pt had BM and brief full of urine. Pt provided peri care, repositioned carefully on stretcher, c-collar adjusted with NT's assistance as pt had shifted collar out of proper placement; linens and bed pads changed; urine sample collected via I&O cath. Pure wick applied.

## 2023-04-09 NOTE — ED Provider Notes (Signed)
[facility] Provider Note    Event Date/Time   First MD Initiated Contact with Patient 04/09/23 [phone]     (approximate)   History   Fall   HPI  Alice Weaver is a 82 y.o. female with a past medical history of Alzheimer's and vascular dementia, type 2 diabetes, hypertension, depression who presents today for evaluation after an unwitnessed fall.  Patient lives at [facility].  She was brought in by EMS.  Patient is on daily aspirin. She is reportedly wheelchair bound.  I spoke with the RN at her care facility who reports that the nurse aide performed the patient's ADLs with her this morning, and stepped out of the room.  When she came back into the room less than a minute later, the patient was on the ground.  She was conscious.  She has not had any vomiting.  She has been at her mental baseline.  She does not walk at baseline, she is hoyer lift only.  No recent infectious/constitutional type symptoms.  Patient Active Problem List   Diagnosis Date Noted   Anemia in chronic kidney disease 06/19/2021   Atrophy of kidney 06/19/2021   Chronic kidney disease 05/21/2021   Major depressive disorder, recurrent, in partial remission (HCC) 01/14/2021   Fracture of distal end of right radius 10/05/2020   Chronic pain 10/02/2020   Fall 10/01/2020   Lymphedema 07/20/2020   Venous (peripheral) insufficiency 07/20/2020   Arthritis 06/22/2020   Type 2 diabetes mellitus with stage 4 chronic kidney disease, without long-term current use of insulin (HCC) 06/22/2020   Essential hypertension 06/22/2020   Skin cancer of breast 06/22/2020   Bilateral leg edema 05/07/2020   Insomnia 05/07/2020   Mixed Alzheimer's and vascular dementia (HCC) 01/03/2020   Primary osteoarthritis of right knee 07/10/2019   Acquired hypothyroidism 10/26/2017   Hyperlipemia, mixed 10/26/2017   Cancer of thyroid (HCC) 05/21/2015   Thyroid nodule 01/25/2015   CMC arthritis, thumb, degenerative  02/03/2014   Status post open reduction with internal fixation of fracture 02/03/2014   Personal history of other malignant neoplasm of skin 12/29/2012          Physical Exam   Triage Vital Signs: ED Triage Vitals  Encounter Vitals Group     BP 04/09/23 0927 (!) 185/75     Systolic BP Percentile --      Diastolic BP Percentile --      Pulse Rate 04/09/23 0927 62     Resp 04/09/23 0927 20     Temp 04/09/23 0927 98.1 F (36.7 C)     Temp Source 04/09/23 0927 Oral     SpO2 04/09/23 0927 98 %     Weight --      Height --      Head Circumference --      Peak Flow --      Pain Score 04/09/23 0924 0     Pain Loc --      Pain Education --      Exclude from Growth Chart --     Most recent vital signs: Vitals:   04/09/23 1411 04/09/23 1419  BP: (!) 145/117 (!) 198/73  Pulse: 65   Resp: 19 18  Temp: 97.9 F (36.6 C)   SpO2: 96%     Physical Exam Vitals and nursing note reviewed.  Constitutional:      General: Awake and alert. No acute distress.    Appearance: Normal appearance. The patient is overweight.  HENT:     Head: Normocephalic.  1 cm laceration to left side of head, no hematoma, oozing small amount of blood.  No periorbital ecchymosis or erythema.    Mouth: Mucous membranes are moist.  Eyes:     General: PERRL. Normal EOMs        Right eye: No discharge.        Left eye: No discharge.     Conjunctiva/sclera: Conjunctivae normal.  Cardiovascular:     Rate and Rhythm: Normal rate.     Pulses: Normal pulses.  Pulmonary:     Effort: Pulmonary effort is normal. No respiratory distress.     Breath sounds: Normal breath sounds.  No chest wall tenderness or ecchymosis. Abdominal:     Abdomen is soft. There is no abdominal tenderness. No rebound or guarding. No distention.  No ecchymosis. Musculoskeletal:        General: No swelling. Normal range of motion.     Cervical back: Normal range of motion and neck supple.  No cervical spine tenderness. Pelvis  stable Skin:    General: Skin is warm and dry.     Capillary Refill: Capillary refill takes less than 2 seconds.     Findings: No rash.  No skin tears or injuries noted with the exception of 1 cm scab block Neurological:     Mental Status: The patient is awake and alert.  At mental baseline according to son at bedside     ED Results / Procedures / Treatments   Labs (all labs ordered are listed, but only abnormal results are displayed) Labs Reviewed  BASIC METABOLIC PANEL - Abnormal; Notable for the following components:      Result Value   Potassium 3.2 (*)    Glucose, Bld 169 (*)    BUN 25 (*)    Calcium 8.7 (*)    GFR, Estimated 56 (*)    All other components within normal limits  CBC WITH DIFFERENTIAL/PLATELET - Abnormal; Notable for the following components:   Platelets 402 (*)    All other components within normal limits  URINALYSIS, W/ REFLEX TO CULTURE (INFECTION SUSPECTED) - Abnormal; Notable for the following components:   Color, Urine COLORLESS (*)    APPearance CLEAR (*)    Protein, ur 30 (*)    All other components within normal limits     EKG     RADIOLOGY I independently reviewed and interpreted imaging and agree with radiologists findings.     PROCEDURES:  Critical Care performed:   Marland KitchenMarland KitchenLaceration Repair  Date/Time: 04/09/2023 2:26 PM  Performed by: Jackelyn Hoehn, PA-C Authorized by: Jackelyn Hoehn, PA-C   Consent:    Consent obtained:  Verbal   Consent given by:  Patient   Risks, benefits, and alternatives were discussed: yes     Risks discussed:  Infection, need for additional repair, nerve damage, pain, poor cosmetic result, poor wound healing, retained foreign body, tendon damage and vascular damage   Alternatives discussed:  No treatment Universal protocol:    Procedure explained and questions answered to patient or proxy's satisfaction: yes     Relevant documents present and verified: yes     Test results available: yes     Imaging  studies available: yes     Required blood products, implants, devices, and special equipment available: yes     Site/side marked: yes     Immediately prior to procedure, a time out was called: yes     Patient identity confirmed:  Verbally with patient Anesthesia:    Anesthesia method:  None Laceration details:    Location:  Scalp   Length (cm):  1   Depth (mm):  2 Pre-procedure details:    Preparation:  Patient was prepped and draped in usual sterile fashion and imaging obtained to evaluate for foreign bodies Exploration:    Limited defect created (wound extended): no     Hemostasis achieved with:  Direct pressure   Imaging outcome: foreign body not noted     Wound exploration: wound explored through full range of motion and entire depth of wound visualized     Wound extent: areolar tissue not violated, fascia not violated, no foreign body, no signs of injury, no nerve damage, no tendon damage, no underlying fracture and no vascular damage     Contaminated: no   Treatment:    Area cleansed with:  Povidone-iodine and saline   Amount of cleaning:  Extensive   Irrigation solution:  Sterile saline   Irrigation method:  Pressure wash and syringe   Debridement:  None   Undermining:  None   Scar revision: no   Skin repair:    Repair method:  Staples   Number of staples:  1 Approximation:    Approximation:  Close Repair type:    Repair type:  Simple Post-procedure details:    Dressing:  Open (no dressing)   Procedure completion:  Tolerated well, no immediate complications    MEDICATIONS ORDERED IN ED: Medications  Tdap (BOOSTRIX) injection 0.5 mL (0.5 mLs Intramuscular Given 04/09/23 1155)     IMPRESSION / MDM / ASSESSMENT AND PLAN / ED COURSE  I reviewed the triage vital signs and the nursing notes.   Differential diagnosis includes, but is not limited to, intracranial hemorrhage, concussion, contusion, scalp laceration, fracture, UTI.  I reviewed the patient's chart.   Patient has a known history of Alzheimer's and vascular dementia, most recently seen primary care doctor in 03/16/2023.  Patient is awake and alert, hemodynamically stable and afebrile.  I spoke with her nurse who cares for her at her home, as well her son who is at the bedside, and both report that she is at her mental baseline.  Labs obtained are overall reassuring.  CT head and neck obtained given her unwitnessed fall per [place] criteria and are negative for any acute findings.  Urinalysis does not suggest urinary tract infection.  She was given an updated tetanus shot given her scalp laceration.  This area was cleaned, and closed with 1 staple.  Discussed staple care and return precautions with son.  Patient was transported back to her facility by EMS.   Patient's presentation is most consistent with acute complicated illness / injury requiring diagnostic workup.  Clinical Course as of 04/09/23 1431  Thu Apr 09, 2023  1037 Spoke with RN at her care facility: Aid went in, did morning ADLs, stepped in, and Sunray fell. Quanna is wheelchair bound, hoyer lift only. Normally very confused [JP]    Clinical Course User Index [JP] Nautika Cressey, Herb Grays, PA-C     FINAL CLINICAL IMPRESSION(S) / ED DIAGNOSES   Final diagnoses:  Fall, initial encounter  Injury of head, initial encounter  Scalp laceration, initial encounter     Rx / DC Orders   ED Discharge Orders     None        Note:  This document was prepared using Dragon voice recognition software and may include unintentional dictation errors.   Oluwaseyi Tull, Herb Grays, PA-C  04/09/23 1431    Willy Eddy, MD 04/09/23 1434

## 2023-04-09 NOTE — ED Notes (Signed)
Pulse ox applied continuously.

## 2023-04-09 NOTE — ED Notes (Signed)
Called [facility] secretary to set up EMS transport for d/c back to [facility] as son states he cannot safely transport pt.

## 2023-04-09 NOTE — ED Notes (Signed)
Given lunch tray and drink @this  time, son feeding patient

## 2023-04-09 NOTE — ED Notes (Signed)
Pt not yet back from imaging.  

## 2023-04-09 NOTE — ED Notes (Addendum)
To bedside with equipment for I&O cath, IV placement and purewick placement. However, pt left for imaging. Will complete once pt back to room. Pt's son to room now. States pt's neuro status currently seems to be her baseline but glad she is being checked just in case. States pt gets frequent UTI's. Son states was told by facility that pt has a "yeast infection".

## 2023-04-09 NOTE — ED Notes (Signed)
Pt's head lac cleansed with iodine and NS mix with gauze. Wound left OTA for provider to further assess if desired. No area of lac noted to be deep; only surface level noted. Oozing stopped. Old dried blood cleansed way from site.

## 2023-04-09 NOTE — ED Notes (Signed)
Verbal okay from provider J. Poggi to remove c-collar. C-collar removed at this time. Pt's son at bedside. Pt's speech intermittently understandable.

## 2023-04-09 NOTE — ED Triage Notes (Signed)
Patient to ED via [facility] from [facility] for an unwitnessed fall. Patient has laceration noted to left side of back of head- bleeding controlled. Takes aspirin- unsure of LOC. Hx of dementia- at baseline per staff. Placed in c-collar by Ems.

## 2023-04-09 NOTE — ED Notes (Signed)
Pt at ease; in NAD; pt won't answer this RN verbally as to if she is in pain currently or not or to what extent.

## 2023-04-09 NOTE — ED Notes (Signed)
Attempted report to [facility].

## 2023-04-09 NOTE — Discharge Instructions (Signed)
Your CT head and neck were normal.  Your hip x-rays were normal.  Your blood work is around your baseline.  Urinalysis does not suggest infection.  The laceration on your scalp was repaired with 1 staple.  Please have this removed in 1 week.  Please return for any new, worsening, or change in symptoms or other concerns.  It was a pleasure caring for you today.

## 2023-04-09 NOTE — ED Notes (Signed)
Pt alert, pt talking but speech is incomprehensible, c-collar remains in place. Pt has bruising to R arm. Lac that is slowly oozing blood and hematoma to L upper head. Pt's briefs currently dry. Pt states "I need to pee"; will apply purewick soon.

## 2023-05-19 DIAGNOSIS — G309 Alzheimer's disease, unspecified: Secondary | ICD-10-CM | POA: Diagnosis not present

## 2023-05-19 DIAGNOSIS — F3341 Major depressive disorder, recurrent, in partial remission: Secondary | ICD-10-CM | POA: Diagnosis not present

## 2023-05-19 DIAGNOSIS — I69318 Other symptoms and signs involving cognitive functions following cerebral infarction: Secondary | ICD-10-CM | POA: Diagnosis not present

## 2023-05-26 DIAGNOSIS — N390 Urinary tract infection, site not specified: Secondary | ICD-10-CM | POA: Diagnosis not present

## 2023-05-27 DIAGNOSIS — N390 Urinary tract infection, site not specified: Secondary | ICD-10-CM | POA: Diagnosis not present

## 2023-06-02 DIAGNOSIS — Z8673 Personal history of transient ischemic attack (TIA), and cerebral infarction without residual deficits: Secondary | ICD-10-CM | POA: Diagnosis not present

## 2023-06-02 DIAGNOSIS — I10 Essential (primary) hypertension: Secondary | ICD-10-CM | POA: Diagnosis not present

## 2023-06-02 DIAGNOSIS — E1122 Type 2 diabetes mellitus with diabetic chronic kidney disease: Secondary | ICD-10-CM | POA: Diagnosis not present

## 2023-06-02 DIAGNOSIS — I1310 Hypertensive heart and chronic kidney disease without heart failure, with stage 1 through stage 4 chronic kidney disease, or unspecified chronic kidney disease: Secondary | ICD-10-CM | POA: Diagnosis not present

## 2023-06-02 DIAGNOSIS — E038 Other specified hypothyroidism: Secondary | ICD-10-CM | POA: Diagnosis not present

## 2023-06-02 DIAGNOSIS — N184 Chronic kidney disease, stage 4 (severe): Secondary | ICD-10-CM | POA: Diagnosis not present

## 2023-07-08 DIAGNOSIS — G309 Alzheimer's disease, unspecified: Secondary | ICD-10-CM | POA: Diagnosis not present

## 2023-07-08 DIAGNOSIS — I69318 Other symptoms and signs involving cognitive functions following cerebral infarction: Secondary | ICD-10-CM | POA: Diagnosis not present

## 2023-07-08 DIAGNOSIS — F3341 Major depressive disorder, recurrent, in partial remission: Secondary | ICD-10-CM | POA: Diagnosis not present

## 2023-09-07 DIAGNOSIS — H9203 Otalgia, bilateral: Secondary | ICD-10-CM | POA: Diagnosis not present

## 2023-09-07 DIAGNOSIS — H6123 Impacted cerumen, bilateral: Secondary | ICD-10-CM | POA: Diagnosis not present

## 2023-09-07 DIAGNOSIS — H900 Conductive hearing loss, bilateral: Secondary | ICD-10-CM | POA: Diagnosis not present

## 2023-09-16 DIAGNOSIS — F3341 Major depressive disorder, recurrent, in partial remission: Secondary | ICD-10-CM | POA: Diagnosis not present

## 2023-09-16 DIAGNOSIS — G309 Alzheimer's disease, unspecified: Secondary | ICD-10-CM | POA: Diagnosis not present

## 2023-09-16 DIAGNOSIS — I69318 Other symptoms and signs involving cognitive functions following cerebral infarction: Secondary | ICD-10-CM | POA: Diagnosis not present

## 2023-09-29 ENCOUNTER — Other Ambulatory Visit: Payer: Self-pay

## 2023-09-29 ENCOUNTER — Emergency Department: Payer: PPO

## 2023-09-29 DIAGNOSIS — S0990XA Unspecified injury of head, initial encounter: Secondary | ICD-10-CM | POA: Diagnosis not present

## 2023-09-29 DIAGNOSIS — I10 Essential (primary) hypertension: Secondary | ICD-10-CM | POA: Diagnosis not present

## 2023-09-29 DIAGNOSIS — F0390 Unspecified dementia without behavioral disturbance: Secondary | ICD-10-CM | POA: Diagnosis not present

## 2023-09-29 DIAGNOSIS — Z043 Encounter for examination and observation following other accident: Secondary | ICD-10-CM | POA: Diagnosis not present

## 2023-09-29 DIAGNOSIS — W050XXA Fall from non-moving wheelchair, initial encounter: Secondary | ICD-10-CM | POA: Insufficient documentation

## 2023-09-29 DIAGNOSIS — Y92129 Unspecified place in nursing home as the place of occurrence of the external cause: Secondary | ICD-10-CM | POA: Diagnosis not present

## 2023-09-29 DIAGNOSIS — S0001XA Abrasion of scalp, initial encounter: Secondary | ICD-10-CM | POA: Insufficient documentation

## 2023-09-29 DIAGNOSIS — E119 Type 2 diabetes mellitus without complications: Secondary | ICD-10-CM | POA: Insufficient documentation

## 2023-09-29 DIAGNOSIS — G319 Degenerative disease of nervous system, unspecified: Secondary | ICD-10-CM | POA: Diagnosis not present

## 2023-09-29 DIAGNOSIS — I6523 Occlusion and stenosis of bilateral carotid arteries: Secondary | ICD-10-CM | POA: Diagnosis not present

## 2023-09-29 DIAGNOSIS — I672 Cerebral atherosclerosis: Secondary | ICD-10-CM | POA: Diagnosis not present

## 2023-09-29 DIAGNOSIS — S0081XA Abrasion of other part of head, initial encounter: Secondary | ICD-10-CM | POA: Diagnosis not present

## 2023-09-29 NOTE — ED Triage Notes (Signed)
 Pt BIB EMS from [facility] for fall.  Per staff, pt was bending down in her wheelchair and she fell.  She has a small laceration to R forehead, bleeding controlled, not on blood thinners, pt poor historian d/t dementia PA looked at wound in triage

## 2023-09-29 NOTE — ED Provider Triage Note (Signed)
 Emergency Medicine Provider Triage Evaluation Note  Alice Weaver , a 82 y.o. female  was evaluated in triage.  Pt complains of falling from the wheelchair without loss of consciousness  Review of Systems  Positive:  Negative  Physical Exam  BP (!) 145/80   Pulse 64   Temp 98.1 F (36.7 C) (Axillary)   Resp 20   SpO2 100%  Gen:   Awake, no distress   Head: Right frontotemporal area with active bleeding, laceration of 0.5 cm, depression of the bone Resp:  Normal effort  MSK:   Moves extremities without difficulty  Other:    Medical Decision Making  Medically screening exam initiated at 10:45 PM.  Appropriate orders placed.  SADAKO CEGIELSKI was informed that the remainder of the evaluation will be completed by another provider, this initial triage assessment does not replace that evaluation, and the importance of remaining in the ED until their evaluation is complete.     Janit Kast, PA-C 09/29/23 2246

## 2023-09-30 ENCOUNTER — Emergency Department
Admission: EM | Admit: 2023-09-30 | Discharge: 2023-09-30 | Disposition: A | Discharge status: Home / Self Care | Payer: PPO | Attending: Emergency Medicine | Admitting: Emergency Medicine

## 2023-09-30 DIAGNOSIS — S0990XA Unspecified injury of head, initial encounter: Secondary | ICD-10-CM

## 2023-09-30 DIAGNOSIS — T148XXA Other injury of unspecified body region, initial encounter: Secondary | ICD-10-CM

## 2023-09-30 DIAGNOSIS — W19XXXA Unspecified fall, initial encounter: Secondary | ICD-10-CM

## 2023-09-30 NOTE — ED Provider Notes (Signed)
 [facility] Provider Note    Event Date/Time   First MD Initiated Contact with Patient 09/30/23 [phone]     (approximate)  History   Chief Complaint: Fall  HPI  Alice Weaver is a 83 y.o. female with a past medical history of dementia, diabetes, hypertension, presents to the emergency department from her nursing facility after falling out of her wheelchair striking her head.  According to report patient has advanced dementia alert and oriented x 1 at baseline which is the patient's current mentation.  Per report patient had a fall from her wheelchair falling forward hitting the right side of her head.  Patient has a laceration or abrasion to the right lateral forehead with dried blood but no active bleeding.  Patient otherwise appears well in bed, no distress.  Patient is unable to contribute to her history.  Physical Exam   Triage Vital Signs: ED Triage Vitals [09/29/23 2244]  Encounter Vitals Group     BP (!) 145/80     Systolic BP Percentile      Diastolic BP Percentile      Pulse Rate 64     Resp 20     Temp 98.1 F (36.7 C)     Temp Source Axillary     SpO2 100 %     Weight      Height      Head Circumference      Peak Flow      Pain Score      Pain Loc      Pain Education      Exclude from Growth Chart     Most recent vital signs: Vitals:   09/29/23 2244 09/30/23 0234  BP: (!) 145/80 (!) 166/90  Pulse: 64 66  Resp: 20 18  Temp: 98.1 F (36.7 C)   SpO2: 100% 96%    General: Awake, no distress.  Patient has dried blood to the right scalp/lateral forehead.  After cleaning the area there is an abrasion but no laceration.  Hemostatic. CV:  Good peripheral perfusion.  Regular rate and rhythm  Resp:  Normal effort.  Equal breath sounds bilaterally.  Abd:  No distention.  Soft, nontender.  No reaction to abdominal palpation. Other:  Good range of motion in all extremities with no pain elicited.   ED Results / Procedures / Treatments    RADIOLOGY  I have reviewed and interpreted CT head images.  No large bleed seen on my evaluation.  Large ventricles. Radiology has read the CT is negative for acute abnormality. CT scan of the C-spine is negative as well.   MEDICATIONS ORDERED IN ED: Medications - No data to display   IMPRESSION / MDM / ASSESSMENT AND PLAN / ED COURSE  I reviewed the triage vital signs and the nursing notes.  Patient's presentation is most consistent with acute presentation with potential threat to life or bodily function.  Patient presents to the emergency department after head injury after a fall.  Overall the patient appears well, advanced dementia which appears to be at baseline.  Patient does have an abrasion to the right forehead/scalp but is hemostatic and no laceration to repair.  CT scan of the head and C-spine are negative.  Good range of motion in all extremities without any pain elicited.  Given the patient's reassuring workup we will discharge back to her nursing facility.  FINAL CLINICAL IMPRESSION(S) / ED DIAGNOSES   Fall Head injury Abrasion    Note:  This document was prepared using Dragon voice recognition software and may include unintentional dictation errors.   Dorothyann Drivers, MD 09/30/23 [phone]

## 2023-09-30 NOTE — ED Notes (Signed)
 Pt waiting on [place] [facility] for transport back to facility. Pt moving arms, folding pads in ed strecher in hallway in no acute distress.

## 2023-09-30 NOTE — ED Notes (Signed)
 [facility], spoke with rep. Earlene Plater he stated they put her on the list to be picked up.

## 2023-09-30 NOTE — ED Notes (Signed)
 Spoke with Lorin Picket from [employer] he stated they will not be transporting the pt., I will reach out to [facility] for transport.

## 2023-09-30 NOTE — Discharge Instructions (Addendum)
 Your workup in the emergency department shows a normal CT scan of the head and C-spine.  You have suffered an abrasion to your scalp.  Please keep this area covered with Neosporin for the next 7 days.  Return to the emergency department for any symptom personally concerning to yourself or staff members.

## 2023-09-30 NOTE — ED Notes (Signed)
 Report to candice yarbourgh, lpn

## 2023-09-30 NOTE — ED Notes (Signed)
 [facility] here for transport. Pt cleansed of incontinent urine, pants and slippers placed in belonging bag and given to ems.

## 2023-09-30 NOTE — ED Notes (Signed)
 Pt continues to fidget with supplies, awaiting transport via ems.

## 2023-11-23 DIAGNOSIS — I69318 Other symptoms and signs involving cognitive functions following cerebral infarction: Secondary | ICD-10-CM | POA: Diagnosis not present

## 2023-11-23 DIAGNOSIS — F3341 Major depressive disorder, recurrent, in partial remission: Secondary | ICD-10-CM | POA: Diagnosis not present

## 2023-11-23 DIAGNOSIS — G309 Alzheimer's disease, unspecified: Secondary | ICD-10-CM | POA: Diagnosis not present

## 2023-11-27 DIAGNOSIS — F3341 Major depressive disorder, recurrent, in partial remission: Secondary | ICD-10-CM | POA: Diagnosis not present

## 2023-11-27 DIAGNOSIS — G309 Alzheimer's disease, unspecified: Secondary | ICD-10-CM | POA: Diagnosis not present

## 2023-11-27 DIAGNOSIS — I69318 Other symptoms and signs involving cognitive functions following cerebral infarction: Secondary | ICD-10-CM | POA: Diagnosis not present

## 2024-01-19 DIAGNOSIS — G309 Alzheimer's disease, unspecified: Secondary | ICD-10-CM | POA: Diagnosis not present

## 2024-01-19 DIAGNOSIS — F3341 Major depressive disorder, recurrent, in partial remission: Secondary | ICD-10-CM | POA: Diagnosis not present

## 2024-02-11 DIAGNOSIS — I130 Hypertensive heart and chronic kidney disease with heart failure and stage 1 through stage 4 chronic kidney disease, or unspecified chronic kidney disease: Secondary | ICD-10-CM | POA: Diagnosis not present

## 2024-02-11 DIAGNOSIS — Z515 Encounter for palliative care: Secondary | ICD-10-CM | POA: Diagnosis not present

## 2024-02-11 DIAGNOSIS — N184 Chronic kidney disease, stage 4 (severe): Secondary | ICD-10-CM | POA: Diagnosis not present

## 2024-02-11 DIAGNOSIS — E039 Hypothyroidism, unspecified: Secondary | ICD-10-CM | POA: Diagnosis not present

## 2024-02-11 DIAGNOSIS — E119 Type 2 diabetes mellitus without complications: Secondary | ICD-10-CM | POA: Diagnosis not present

## 2024-02-11 DIAGNOSIS — I10 Essential (primary) hypertension: Secondary | ICD-10-CM | POA: Diagnosis not present

## 2024-03-02 DIAGNOSIS — M170 Bilateral primary osteoarthritis of knee: Secondary | ICD-10-CM | POA: Diagnosis not present

## 2024-03-02 DIAGNOSIS — F03C0 Unspecified dementia, severe, without behavioral disturbance, psychotic disturbance, mood disturbance, and anxiety: Secondary | ICD-10-CM | POA: Diagnosis not present

## 2024-03-02 DIAGNOSIS — Z515 Encounter for palliative care: Secondary | ICD-10-CM | POA: Diagnosis not present

## 2024-05-20 DIAGNOSIS — H6123 Impacted cerumen, bilateral: Secondary | ICD-10-CM | POA: Diagnosis not present

## 2024-05-20 DIAGNOSIS — H9222 Otorrhagia, left ear: Secondary | ICD-10-CM | POA: Diagnosis not present

## 2024-05-24 DIAGNOSIS — G309 Alzheimer's disease, unspecified: Secondary | ICD-10-CM | POA: Diagnosis not present

## 2024-05-24 DIAGNOSIS — F3341 Major depressive disorder, recurrent, in partial remission: Secondary | ICD-10-CM | POA: Diagnosis not present

## 2024-05-24 DIAGNOSIS — I69318 Other symptoms and signs involving cognitive functions following cerebral infarction: Secondary | ICD-10-CM | POA: Diagnosis not present

## 2024-07-30 DEATH — deceased
